# Patient Record
Sex: Female | Born: 1990
Health system: Southern US, Community
[De-identification: ages and names within clinical notes are randomized; demographics above are authoritative.]

## PROBLEM LIST (undated history)

## (undated) DIAGNOSIS — Z789 Other specified health status: Secondary | ICD-10-CM

## (undated) HISTORY — PX: APPENDECTOMY: SHX54

---

## 2018-12-28 NOTE — L&D Delivery Note (Addendum)
Delivery Note  Danielle Smith is a 28 y.o. G2P1001 s/p SVD at [redacted]w[redacted]d. She was admitted for PD IOL  ROM: 2h 5m with clear  fluid GBS Status: negative   Maximum Maternal Temperature: 98.4 F  Labor Progress: . Patient presented to L&D for IOL for post dates SVE was 3 cm and induced with pitocin, she then progressed to complete.   Delivery Date/Time: Delivery: Called to room and patient was complete and pushing. Head delivered LOA. Tight nuchal cord present x 1, unable to reduce and delivered through with somersault. Shoulder and body delivered in usual fashion. Infant with spontaneous cry, placed on mother's abdomen, dried and stimulated. Cord clamped x 2 after 1-minute delay, and cut by father. Cord blood drawn. Placenta delivered spontaneously with gentle cord traction. Fundus firm with massage and Pitocin. Labia, perineum, vagina, and cervix inspected inspected with 2nd degree perineal laceration which was repaired in usual fashion with 3-0 vicryl.  Baby Weight:  4465 grams  Placenta: Sent to L&D Complications: Nuchal x 1 Lacerations: 2nd degree perineal laceration  EBL: 350 ml  Analgesia: Epidural  Infant: APGAR (1 MIN): 8   APGAR (5 MINS): 9    Maxie Better, MD, PGY1  [facility], [place] 10/25/2019, 8:11 PM   OB FELLOW ATTESTATION  I was present, gloved, and supervising throughout the delivery and repair.   Augustin Coupe, MD/[facility] OB Fellow  10/25/2019, 8:23 PM

## 2019-05-10 ENCOUNTER — Other Ambulatory Visit: Payer: Self-pay

## 2019-05-10 ENCOUNTER — Ambulatory Visit (INDEPENDENT_AMBULATORY_CARE_PROVIDER_SITE_OTHER): Payer: BLUE CROSS/BLUE SHIELD | Admitting: Obstetrics and Gynecology

## 2019-05-10 VITALS — BP 115/71 | HR 81 | Ht 65.0 in | Wt 192.0 lb

## 2019-05-10 DIAGNOSIS — O0932 Supervision of pregnancy with insufficient antenatal care, second trimester: Secondary | ICD-10-CM

## 2019-05-10 DIAGNOSIS — Z113 Encounter for screening for infections with a predominantly sexual mode of transmission: Secondary | ICD-10-CM

## 2019-05-10 DIAGNOSIS — Z3A17 17 weeks gestation of pregnancy: Secondary | ICD-10-CM

## 2019-05-10 DIAGNOSIS — Z3480 Encounter for supervision of other normal pregnancy, unspecified trimester: Secondary | ICD-10-CM

## 2019-05-10 NOTE — Progress Notes (Signed)
History:   Danielle Smith is a 28 y.o. G2P1001 at 6372w0d by LMP being seen today for her first obstetrical visit.  Her obstetrical history is significant for Late to care. Patient does intend to breast feed. Pregnancy history fully reviewed. Desired pregnancy. Patient recently moved from [place]. She is new to the area.   Patient reports no complaints.  HISTORY: OB History  Gravida Para Term Preterm AB Living  2 1 1  0 0 1  SAB TAB Ectopic Multiple Live Births  0 0 0 0 0    # Outcome Date GA Lbr Len/2nd Weight Sex Delivery Anes PTL Lv  2 Current           1 Term             Last pap smear was done 2020 and was normal Patient is fully dressed today at her visit and is ok with having pap done PP.   History reviewed. No pertinent past medical history. Past Surgical History:  Procedure Laterality Date  . APPENDECTOMY     Family History  Problem Relation Age of Onset  . Breast cancer Paternal Grandmother    Social History   Tobacco Use  . Smoking status: Never Smoker  . Smokeless tobacco: Never Used  Substance Use Topics  . Alcohol use: Never    Frequency: Never  . Drug use: Never   Not on File No current outpatient medications on file prior to visit.   No current facility-administered medications on file prior to visit.     Review of Systems Pertinent items noted in HPI and remainder of comprehensive ROS otherwise negative. Physical Exam:   Vitals:   05/10/19 1024 05/10/19 1037  BP: 115/71   Pulse: 81   Weight: 192 lb (87.1 kg)   Height:  5\' 5"  (1.651 m)   Fetal Heart Rate (bpm): 153 Uterus: Size equal to dates  System: General: well-developed, well-nourished female in no acute distress   Breasts:  normal appearance, no masses or tenderness bilaterally   Skin: normal coloration and turgor, no rashes   Neurologic: oriented, normal, negative, normal mood   Extremities: normal strength, tone, and muscle mass, ROM of all joints is normal   HEENT PERRLA, extraocular  movement intact and sclera clear, anicteric   Mouth/Teeth mucous membranes moist, pharynx normal without lesions and dental hygiene good   Neck supple and no masses   Cardiovascular: regular rate and rhythm   Respiratory:  no respiratory distress, normal breath sounds   Abdomen: soft, non-tender; bowel sounds normal; no masses,  no organomegaly     Assessment:    Pregnancy: G2P1001 Patient Active Problem List   Diagnosis Date Noted  . Late prenatal care affecting pregnancy in second trimester 05/11/2019  . Supervision of other normal pregnancy, antepartum 05/10/2019    Plan:    1. Supervision of other normal pregnancy, antepartum  - Obstetric panel - HIV antibody (with reflex) - GC/Chlamydia probe amp ([place]not at [facility] - Culture, OB Urine - Babyscripts Schedule Optimization   Initial labs drawn. Continue prenatal vitamins. Genetic Screening discussed, Declined all genetic screening.: declined. Ultrasound discussed; fetal anatomic survey: requested. Problem list reviewed and updated. The nature [facility] with multiple MDs and other Advanced Practice Providers was explained to patient; also emphasized that residents, students are part of our team. Routine obstetric precautions reviewed. Return in about 4 weeks (around 06/07/2019) for virtual visit. Marland Kitchen.     Rasch, Victorino DikeJennifer  I, NP [facility] for [employer], [facility]

## 2019-05-10 NOTE — Progress Notes (Signed)
Pt thinks last pap was 3 years ago- normal

## 2019-05-10 NOTE — Patient Instructions (Addendum)
AREA PEDIATRIC/FAMILY PRACTICE PHYSICIANS  [place] ([address]) . [facility] o Chambliss, MD; Eniola, MD; Hale, MD; Hensel, MD; McDiarmid, MD; McIntyer, MD; Neal, MD; Walden, MD o [address] [place], [place] [address] o [phone] o Mon-Fri 8:30-12:30, 1:30-5:00 o Providers come to see babies at [facility] o Accepting Medicaid . [facility] o Limited providers who accept newborns: Koirala, MD; Morrow, MD; Wolters, MD o [address], [place], [place] [address] o [phone] o Mon-Fri 8:00-5:30 o Babies seen by providers at [facility] o Does NOT accept Medicaid o Please call early in hospitalization for appointment (limited availability)  . [facility] o Mulberry, MD o [address] [place], [place] [address] o [phone] o Mon, Tue, Thur, Fri 8:30-5:00, Wed 10:00-7:00 (closed 1-2pm) o Babies seen by [facility] providers o Accepting Medicaid . [facility] - Pediatrician o Rubin, MD o [address], [place], [place] [address] o [phone] o Mon-Fri 8:30-5:00, Sat 8:30-12:00 o Provider comes to see babies at [facility] o Accepting Medicaid o Must have been referred from current patients or contacted office prior to delivery . [facility] ([facility]) o Brown, MD; Chandler, MD; Ettefagh, MD; Grant, MD; Lester, MD; McCormick, MD; McQueen, MD; Prose, MD; Simha, MD; Stanley, MD; Stryffeler, NP; Tebben, NP o [address], [place], [place] [address] o [phone] o Mon, Tue, Thur, Fri 8:30-5:30, Wed 9:30-5:30, Sat 8:30-12:30 o Babies seen by [facility] providers o Accepting Medicaid o Only accepting infants of first-time parents or siblings of current patients o Hospital discharge coordinator will make follow-up appointment . Jack Amos o [address],  [place], [place]  [address] o [phone]   Fax - [phone] . [facility] o [address], [place], [place]  [address] o Phone - [phone]   Fax - [phone] . Shilpa Gosrani o [address], [place], [place]  [address] o [phone]  [place] ([address]) . [facility] o Bates, MD; [place], MD; Cooper, Cox, MD; MD; Davis, MD; Dovico, MD; Ettefaugh, MD; Little, MD; Lowe, MD; Keiffer, MD; Melvin, MD; Sumner, MD; Williams, MD o [address], [place], [place] [address] o [phone] o Mon-Fri 8:30-5:00 (extended evenings Mon-Thur as needed), Sat-Sun 10:00-1:00 o Providers come to see babies at [facility] o Accepting Medicaid for families of first-time babies and families with all children in the household age 3 and under. Must register with office prior to making appointment (M-F only). . [facility] o Henson, NP; Knapp, MD; Lalonde, MD; Tysinger, PA o [address] [place], [place] [address] o [phone] o Mon-Fri 8:00-5:00 o Babies seen by providers at [facility] o Does NOT accept Medicaid/Commercial Insurance Only . [facility] [place] ([facility])  o Artis, MD; Barnes, MD; Bratton, MD; Coccaro, MD; Lockett Gardner, MD; Kramer, MD; Marshall, MD; Netherton, MD; Poleto, MD; Skinner, MD o [address] [place], [place] [address] o [phone] o Mon-Fri 8:30-5:30, Sat (Oct.-Mar.) 9:00-1:00 o Babies seen by providers at [facility] o Accepting Medicaid  [place] ([address]) . [facility] o Reid, MD; Warner, MD o [address], [place],  [address] o [phone] o Mon-Fri 8:30-5:00, Sat 8:30-12:00 o Providers come to see babies at [facility] o Does NOT accept Medicaid . [facility] o Becker, PA; Hagler, MD; Scifres, PA; Sun, MD; Swayne, MD o [address],  [place], [place] [address] o [phone] o Mon-Fri 8:00-5:00 o Babies seen by providers at [facility] o Does NOT accept Medicaid o Only accepting babies of parents who are patients o Please call early in hospitalization for appointment (limited availability) . [facility] o Clark, MD; Frye, MD; Kelleher, MD; Mack, NP; Miller, MD; O'Keller, MD; Patterson, NP; Pudlo, MD; Puzio, MD; Thomas, MD; Tucker, MD; Twiselton, MD o [address], [place], [place] [address] o [phone] o Mon-Fri 8:00-5:00, Sat 9:00-12:00 o Providers come to see babies at [facility] o Does NOT accept Medicaid  [place] ([address]) . [facility] o Limited providers accepting new patients: Brake, NP; Wharton, PA o [address], [place], [place] [address] o [phone] o Mon-Fri 8:00-5:00 o Babies seen by providers at [facility] o Does NOT accept Medicaid o Only accepting babies of parents who are patients o Please call early in hospitalization for appointment (limited availability) . [facility] o Gay, MD; Quinlan, MD o [address] [place], [place] [address] o [phone] (press 1 to schedule appointment) o Mon-Fri 8:00-5:00 o Providers come to see babies at [facility] o Does NOT accept Medicaid . [facility] o Mazer, MD o [address] [place], [place] [address] o [phone] o Mon-Fri 8:30-5:00 (lunch 12:30-1:00), extended hours by appointment only Wed 5:00-6:30 o Babies seen by [facility] providers o Accepting Medicaid . [facility] o Banks, MD; Jordan, MD; Koberlein, MD o [address], [place], [place] [address] o [phone] o Mon-Fri 8:00-5:00 o Babies seen by [facility] providers o Does NOT accept Medicaid . [facility] o Parker, MD; Hunter, MD; Wallace, DO o [address] [place], [place]  [address] o [phone] o Mon-Fri 8:00-5:00 o Babies seen by [facility] providers o Does NOT accept Medicaid . [facility] o Brandon, PA; Brecken, PA; Christy, NP; Dees, MD; DeClaire, MD; DeWeese, MD; Hansen, NP; Mills, NP; Parrish, NP; Smoot, NP; Summer, MD; Vapne, MD o [address] [place], [place] [address] o [phone] o Mon-Fri 8:30-5:00, Sat 10:00-1:00 o Providers come to see babies at [facility] o Does NOT accept Medicaid o Free prenatal information session Tuesdays at 4:45pm . [facility] o Bouska, MD; Gordon, PA; Jeffery, PA; Weber, PA o [address] [place] [address] o [phone] o Mon-Fri 7:30-5:30 o Babies seen by [facility] providers . [facility] o [address], [place], [place], [place]  [address] o [phone]   Fax - [phone]  [place] ([phone]) . [facility] o Reese, MD o [address] [place], [place] [address] o [phone] o Mon-Thur 8:00-6:00 o Providers come to see babies at [facility] o Accepting Medicaid . [facility] o Anderson, NP; Badger, MD; Beal, PA; Spencer, PA o [address] [place],  [address] o [phone] o Mon-Thur 7:30-7:30, Fri 7:30-4:30 o Babies seen by [facility] providers o Accepting Medicaid . [facility] o Agbuya, MD; Klett, NP; Romgoolam, MD o [address], ,  [address] o [phone] o Mon-Fri 8:30-5:00, Sat 8:30-12:00 o Providers come to see babies at [facility] o Accepting Medicaid o Must have "Meet & Greet" appointment at office prior to delivery . [facility]  ([facility] ) o McCord,   MD; Juleen China, MD; Clydene Laming, [address], [place], [place] [address] o [phone] o Mon-Wed 8:00-6:00, Thur-Fri 8:00-5:00, Sat 9:00-12:00 o Providers come to  see babies at [facility] o Does NOT accept Medicaid o Only accepting siblings of current patients . [facility] [place], [place], [place], [place]  [address] o [phone]   Fax [phone] . [facility] [place] [address], [place], [place]  [address] o [phone]   Fax - Morton Gorman [phone] & [phone]) . Therapist, music at [place], DO; [place], [address] [place], [place] [address] o [phone] o Mon-Fri 7:00-5:00 o Babies seen by [facility] providers o Does NOT accept Medicaid . [place], MD; [place], [place]; [place], [place], [place], [place] [address] o [phone] o Mon-Fri 8:00-5:00 o Babies seen by [facility] providers o Accepting Medicaid . Lamont, MD; [place], [place]; [place], NP; [place], [place], [place], [place] [address] o [phone] o Mon-Fri 8:00-5:00 o Babies seen by providers at [facility] [place] [phone]) . [facility] [place], [place] o [address] [place], [place] [address] o [phone] o Mon-Fri 8:00-5:00 o Babies seen by [facility] providers o Does NOT accept Medicaid o Limited availability, please call early in hospitalization to schedule follow-up . [facility] Leilani Merl, PA; Maisie Fus, MD; [place], MD; [place], [place]; Jeannine Kitten, MD; [place], [place] [facility] [address], [place], [place] [address] o [phone] o Mon-Fri 8:30-5:00, Sat 9:00-12:00 o Babies seen by providers at [facility] o Accepting Medicaid o Please register online then schedule online or call office o [email or web address] . [place] [facility]  [place]) Kristian Covey, NP; Dwyane Dee, MD; Leonidas Romberg, PA o [address] [place], [place], [place] [address] o [phone] o Mon-Fri 8:00-5:00 o Babies seen by providers at [facility] o Accepting Medicaid . [place] ([facility]) Dairl Ponder, MD; Rayvon Char, NP; Melina Modena, MD o [address] [place], [place], [place] [address] o [phone] o Mon-Fri 8:00-5:30, Sat&Sun by appointment (phones open at 8:30) o Babies seen by [facility] providers o Accepting Medicaid o Must be a first-time baby or sibling of current patient . [place], [address], [place], [place]  [address] o [phone]   Fax - [phone]  [place] [phone] & [phone]) . [place], [place]; [place], [place]; Benjamine Mola, MD; [place], [place]; Harrell Lark, MD o [address] [place], [place] [address] o [phone] o Mon-Thur 8:00-7:00, Fri 8:00-5:00, Sat 8:00-12:00, Sun 9:00-12:00 o Babies seen by [facility] providers o Accepting Medicaid . [facility], MD; Ruthann Cancer, MD; [facility], MD o [address], [place], [place] [address] o [phone] o Mon-Thur 8:00-5:00 o Babies seen by providers at [facility] o Accepting Medicaid . [facility] [place], MD; Coe[place]Goins, MD; Amedeo Plenty, MD; Bobby Rumpf, MD; List, MD; Lavonia Drafts, MD; Ruthann Cancer, MD; Selinda Eon, MD; Audie Box, MD; Jim Like, MD; Christie Nottingham, MD; Hubbard Hartshorn, MD; Modena Nunnery, MD o [address] [place], [place]  [address] o [phone] o Mon-Fri 8:00-5:30, Sat (Oct.-Mar.) 9:00-1:00 o Babies seen by providers at [facility] o Accepting Medicaid o Must fill out new patient packet, available online at [email or web address] . [facility] [place] [facility]) Simone Curia, NP; Tiburcio Pea, NP; Tresa Endo, NP; Whitney Post, MD;  [place], [place]; Hennie Duos, MD; Wynne Dust, MD; Kavin Leech, NP o [address], [place], [place] [address] o [phone] o Mon-Thur 8:00-5:30, Fri 8:00-5:00 o Babies seen by providers at [facility] o Accepting [facility] [phone]) . [facility] o [place], [place]; [place], MD; Tanya Nones, MD; [place], [place] o [address] [place], [place] [address] o [phone] o Mon-Fri 8:00-5:00 o Babies seen by providers at [facility] o Accepting [facility] [phone]) . [facility] o [place], DO; Lenise Arena, MD; [place], [place] o [address], [place], [place] [address] o [phone] o Mon-Fri 8:00-5:00 o Babies seen by providers at [facility] o Does NOT accept Medicaid o Limited appointment availability, please call early in hospitalization  . Nature conservation officer at [facility] o Accident, DO; [place], MD o [address] [place], [place] [address] o [phone] o Mon-Fri 8:00-5:00 o Babies seen by [facility] providers o Does NOT accept Medicaid . [facility] Lorrine Kin, MD; Ninetta Lights, MD; [place], [place]; [place], MD o [address], [place], [place] [address] o [phone] o Mon-Fri 8:00-5:00 o After hours clinic [facility] [place], [place] [address]) [phone] Mon-Fri 5:00-8:00, Sat 12:00-6:00, Sun 10:00-4:00 o Babies seen by [facility] providers o Accepting Medicaid . [facility] o [address], [place], [place]  [address] o [phone]   Fax - [phone]  [place] [phone]) . Nature conservation officer at [facility], MD o [address] [place], [place], [place] [address] o [phone] o Mon-Fri 8:00-5:00 o Babies seen by [facility] providers o Does NOT accept Medicaid . [facility] [place] [facility] [place]) Tomi Likens, MD o [address] [place] [address], [place], [place]  [address] o [phone] o Mon-Thur 8:00-7:00, Fri 8:00-5:00, Sat 8:00-12:00 o Babies seen by providers at [facility] o Accepting Medicaid - but does not have vaccinations in office (must be received elsewhere) o Limited availability, please call early in hospitalization   ([phone]) . [facility]  o Wyvonne Lenz, MD o [address], [place] [address] o [phone]  Fax [phone] Tdap Vaccine (Tetanus, Diphtheria and Pertussis): What You Need to Know 1. Why get vaccinated? Tetanus, diphtheria and pertussis are very serious diseases. Tdap vaccine can protect [place] from these diseases. And, Tdap vaccine given to pregnant women can protect newborn babies against pertussis.[place] TETANUS (Lockjaw) is rare in the [place] today. It causes painful muscle tightening and stiffness, usually all over the body.  It can lead to tightening of muscles in the head and neck so you can't open your mouth, swallow, or sometimes even breathe. Tetanus kills about 1 out of 10 people who are infected even after receiving the best medical care. DIPHTHERIA is also rare in the [place] today. It can cause a thick coating to form in the back of the throat.  It can lead to breathing problems, heart failure, paralysis, and death. PERTUSSIS (Whooping Cough) causes severe coughing spells, which can cause difficulty breathing, vomiting and disturbed sleep.  It can also lead  to weight loss, incontinence, and rib fractures. Up to 2 in 100 adolescents and 5 in 100 adults with pertussis are hospitalized or have complications, which could include pneumonia or death. These diseases are caused by bacteria. Diphtheria and pertussis are spread from person to person through secretions from coughing or sneezing. Tetanus enters the body through cuts, scratches, or wounds. Before vaccines, as many as 200,000 cases of diphtheria, 200,000 cases of pertussis, and hundreds of cases of tetanus, were  reported in the [place] each year. Since vaccination began, reports of cases for tetanus and diphtheria have dropped by about 99% and for pertussis by about 80%. 2. Tdap vaccine Tdap vaccine can protect adolescents and adults from tetanus, diphtheria, and pertussis. One dose of Tdap is routinely given at age 211 or 5912. People who did not get Tdap at that age should get it as soon as possible. Tdap is especially important for healthcare professionals and anyone having close contact with a baby younger than 12 months. Pregnant women should get a dose of Tdap during every pregnancy, to protect the newborn from pertussis. Infants are most at risk for severe, life-threatening complications from pertussis. Another vaccine, called Td, protects against tetanus and diphtheria, but not pertussis. A Td booster should be given every 10 years. Tdap may be given as one of these boosters if you have never gotten Tdap before. Tdap may also be given after a severe cut or burn to prevent tetanus infection. Your doctor or the person giving you the vaccine can give you more information. Tdap may safely be given at the same time as other vaccines. 3. Some people should not get this vaccine  A person who has ever had a life-threatening allergic reaction after a previous dose of any diphtheria, tetanus or pertussis containing vaccine, OR has a severe allergy to any part of this vaccine, should not get Tdap vaccine. Tell the person giving the vaccine about any severe allergies.  Anyone who had coma or long repeated seizures within 7 days after a childhood dose of DTP or DTaP, or a previous dose of Tdap, should not get Tdap, unless a cause other than the vaccine was found. They can still get Td.  Talk to your doctor if you: ? have seizures or another nervous system problem, ? had severe pain or swelling after any vaccine containing diphtheria, tetanus or pertussis, ? ever had a condition called Guillain-Barr Syndrome  (GBS), ? aren't feeling well on the day the shot is scheduled. 4. Risks With any medicine, including vaccines, there is a chance of side effects. These are usually mild and go away on their own. Serious reactions are also possible but are rare. Most people who get Tdap vaccine do not have any problems with it. Mild problems following Tdap (Did not interfere with activities)  Pain where the shot was given (about 3 in 4 adolescents or 2 in 3 adults)  Redness or swelling where the shot was given (about 1 person in 5)  Mild fever of at least 100.64F (up to about 1 in 25 adolescents or 1 in 100 adults)  Headache (about 3 or 4 people in 10)  Tiredness (about 1 person in 3 or 4)  Nausea, vomiting, diarrhea, stomach ache (up to 1 in 4 adolescents or 1 in 10 adults)  Chills, sore joints (about 1 person in 10)  Body aches (about 1 person in 3 or 4)  Rash, swollen glands (uncommon) Moderate problems following Tdap (Interfered with activities, but  did not require medical attention)  Pain where the shot was given (up to 1 in 5 or 6)  Redness or swelling where the shot was given (up to about 1 in 16 adolescents or 1 in 12 adults)  Fever over 102F (about 1 in 100 adolescents or 1 in 250 adults)  Headache (about 1 in 7 adolescents or 1 in 10 adults)  Nausea, vomiting, diarrhea, stomach ache (up to 1 or 3 people in 100)  Swelling of the entire arm where the shot was given (up to about 1 in 500). Severe problems following Tdap (Unable to perform usual activities; required medical attention)  Swelling, severe pain, bleeding and redness in the arm where the shot was given (rare). Problems that could happen after any vaccine:  People sometimes faint after a medical procedure, including vaccination. Sitting or lying down for about 15 minutes can help prevent fainting, and injuries caused by a fall. Tell your doctor if you feel dizzy, or have vision changes or ringing in the ears.  Some  people get severe pain in the shoulder and have difficulty moving the arm where a shot was given. This happens very rarely.  Any medication can cause a severe allergic reaction. Such reactions from a vaccine are very rare, estimated at fewer than 1 in a million doses, and would happen within a few minutes to a few hours after the vaccination. As with any medicine, there is a very remote chance of a vaccine causing a serious injury or death. The safety of vaccines is always being monitored. For more information, visit: http://floyd.org/ 5. What if there is a serious problem? What should I look for?  Look for anything that concerns you, such as signs of a severe allergic reaction, very high fever, or unusual behavior. Signs of a severe allergic reaction can include hives, swelling of the face and throat, difficulty breathing, a fast heartbeat, dizziness, and weakness. These would usually start a few minutes to a few hours after the vaccination. What should I do?  If you think it is a severe allergic reaction or other emergency that can't wait, call 9-1-1 or get the person to the nearest hospital. Otherwise, call your doctor.  Afterward, the reaction should be reported to the Vaccine Adverse Event Reporting System (VAERS). Your doctor might file this report, or you can do it yourself through the VAERS web site at www.vaers.LAgents.no, or by calling 1-(307) 257-6437. VAERS does not give medical advice. 6. The National Vaccine Injury Compensation Program The [employer] Vaccine Injury Compensation Program (VICP) is a federal program that was created to compensate people who may have been injured by certain vaccines. Persons who believe they may have been injured by a vaccine can learn about the program and about filing a claim by calling 1-785-250-9645 or visiting the VICP website at SpiritualWord.at. There is a time limit to file a claim for compensation. 7. How can I learn  more?  Ask your doctor. He or she can give you the vaccine package insert or suggest other sources of information.  Call your local or state health department.  Contact the Centers for Disease Control and Prevention (CDC): ? Call (250)786-2374 (1-800-CDC-INFO) or ? Visit CDC's website at PicCapture.uy Vaccine Information Statement Tdap Vaccine (02/20/2014) This information is not intended to replace advice given to you by your health care provider. Make sure you discuss any questions you have with your health care provider. Document Released: 06/14/2012 Document Revised: 08/01/2018 Document Reviewed: 08/01/2018 Elsevier Interactive Patient Education  2019 [place].

## 2019-05-11 DIAGNOSIS — O0932 Supervision of pregnancy with insufficient antenatal care, second trimester: Secondary | ICD-10-CM | POA: Insufficient documentation

## 2019-05-11 LAB — GC/CHLAMYDIA PROBE AMP (~~LOC~~) NOT AT ARMC
Chlamydia: NEGATIVE
Neisseria Gonorrhea: NEGATIVE

## 2019-05-12 LAB — OBSTETRIC PANEL
Absolute Monocytes: 390 cells/uL (ref 200–950)
Antibody Screen: NOT DETECTED
Basophils Absolute: 30 cells/uL (ref 0–200)
Basophils Relative: 0.4 %
Eosinophils Absolute: 98 cells/uL (ref 15–500)
Eosinophils Relative: 1.3 %
HCT: 36 % (ref 35.0–45.0)
Hemoglobin: 12.6 g/dL (ref 11.7–15.5)
Hepatitis B Surface Ag: NONREACTIVE
Lymphs Abs: 1530 cells/uL (ref 850–3900)
MCH: 30.7 pg (ref 27.0–33.0)
MCHC: 35 g/dL (ref 32.0–36.0)
MCV: 87.6 fL (ref 80.0–100.0)
MPV: 11.8 fL (ref 7.5–12.5)
Monocytes Relative: 5.2 %
Neutro Abs: 5453 cells/uL (ref 1500–7800)
Neutrophils Relative %: 72.7 %
Platelets: 171 10*3/uL (ref 140–400)
RBC: 4.11 10*6/uL (ref 3.80–5.10)
RDW: 13 % (ref 11.0–15.0)
RPR Ser Ql: NONREACTIVE
Rubella: 11.3 index
Total Lymphocyte: 20.4 %
WBC: 7.5 10*3/uL (ref 3.8–10.8)

## 2019-05-12 LAB — CULTURE, OB URINE

## 2019-05-12 LAB — URINE CULTURE, OB REFLEX

## 2019-05-12 LAB — HIV ANTIBODY (ROUTINE TESTING W REFLEX): HIV 1&2 Ab, 4th Generation: NONREACTIVE

## 2019-05-26 ENCOUNTER — Ambulatory Visit (HOSPITAL_COMMUNITY)
Admission: RE | Admit: 2019-05-26 | Discharge: 2019-05-26 | Disposition: A | Discharge status: Home / Self Care | Payer: BLUE CROSS/BLUE SHIELD | Source: Ambulatory Visit | Attending: Obstetrics and Gynecology | Admitting: Obstetrics and Gynecology

## 2019-05-26 ENCOUNTER — Other Ambulatory Visit: Payer: Self-pay

## 2019-05-26 DIAGNOSIS — Z3480 Encounter for supervision of other normal pregnancy, unspecified trimester: Secondary | ICD-10-CM | POA: Diagnosis not present

## 2019-05-26 DIAGNOSIS — Z363 Encounter for antenatal screening for malformations: Secondary | ICD-10-CM

## 2019-05-26 DIAGNOSIS — Z3A19 19 weeks gestation of pregnancy: Secondary | ICD-10-CM | POA: Diagnosis not present

## 2019-05-29 ENCOUNTER — Other Ambulatory Visit (HOSPITAL_COMMUNITY): Payer: Self-pay | Admitting: *Deleted

## 2019-05-29 DIAGNOSIS — Z362 Encounter for other antenatal screening follow-up: Secondary | ICD-10-CM

## 2019-06-07 ENCOUNTER — Ambulatory Visit (INDEPENDENT_AMBULATORY_CARE_PROVIDER_SITE_OTHER): Payer: BC Managed Care – PPO | Admitting: Obstetrics and Gynecology

## 2019-06-07 ENCOUNTER — Other Ambulatory Visit: Payer: Self-pay

## 2019-06-07 VITALS — BP 112/68 | HR 84 | Wt 196.0 lb

## 2019-06-07 DIAGNOSIS — Z3482 Encounter for supervision of other normal pregnancy, second trimester: Secondary | ICD-10-CM

## 2019-06-07 DIAGNOSIS — Z3A21 21 weeks gestation of pregnancy: Secondary | ICD-10-CM

## 2019-06-07 DIAGNOSIS — Z3480 Encounter for supervision of other normal pregnancy, unspecified trimester: Secondary | ICD-10-CM

## 2019-06-07 NOTE — Progress Notes (Signed)
   TELEHEALTH VIRTUAL OBSTETRICS VISIT ENCOUNTER NOTE  I connected with Danielle Smith on 06/07/19 at 10:15 AM EDT by telephone at home and verified that I am speaking with the correct person using two identifiers.   I discussed the limitations, risks, security and privacy concerns of performing an evaluation and management service by telephone and the availability of in person appointments. I also discussed with the patient that there may be a patient responsible charge related to this service. The patient expressed understanding and agreed to proceed.  Subjective:  Danielle Smith is a 28 y.o. G2P1001 at [redacted]w[redacted]d being followed for ongoing prenatal care.  She is currently monitored for the following issues for this low-risk pregnancy and has Supervision of other normal pregnancy, antepartum and Late prenatal care affecting pregnancy in second trimester on their problem list.  Patient reports no complaints. Reports fetal movement. Denies any contractions, bleeding or leaking of fluid.   The following portions of the patient's history were reviewed and updated as appropriate: allergies, current medications, past family history, past medical history, past social history, past surgical history and problem list.   Objective:   General:  Alert, oriented and cooperative.   Mental Status: Normal mood and affect perceived. Normal judgment and thought content.  Rest of physical exam deferred due to type of encounter  Assessment and Plan:  Pregnancy: G2P1001 at [redacted]w[redacted]d  1. Supervision of other normal pregnancy, antepartum  BP 112/68 Doing well Virtual visit in 4 weeks, in office visit in 8 weeks for 2 hour GTT    There are no diagnoses linked to this encounter. Preterm labor symptoms and general obstetric precautions including but not limited to vaginal bleeding, contractions, leaking of fluid and fetal movement were reviewed in detail with the patient.  I discussed the assessment and treatment plan  with the patient. The patient was provided an opportunity to ask questions and all were answered. The patient agreed with the plan and demonstrated an understanding of the instructions. The patient was advised to call back or seek an in-person office evaluation/go to MAU at [facility] for any urgent or concerning symptoms. Please refer to After Visit Summary for other counseling recommendations.   I provided 10 minutes of non-face-to-face time during this encounter.  Return in about 4 weeks (around 07/05/2019) for Virtual visit ok .  Future Appointments  Date Time Provider [place]  06/23/2019  2:00 PM [facility]  06/23/2019  2:00 PM [facility]-MFC [place] 3 [facility]-MFCUS MFC-US    Jennifer Rasch, NP Center for [facility], [place]

## 2019-06-23 ENCOUNTER — Ambulatory Visit (HOSPITAL_COMMUNITY): Payer: BC Managed Care – PPO | Admitting: *Deleted

## 2019-06-23 ENCOUNTER — Ambulatory Visit (HOSPITAL_COMMUNITY)
Admission: RE | Admit: 2019-06-23 | Discharge: 2019-06-23 | Disposition: A | Discharge status: Home / Self Care | Payer: BC Managed Care – PPO | Source: Ambulatory Visit | Attending: Obstetrics and Gynecology | Admitting: Obstetrics and Gynecology

## 2019-06-23 ENCOUNTER — Encounter (HOSPITAL_COMMUNITY): Payer: Self-pay | Admitting: *Deleted

## 2019-06-23 ENCOUNTER — Other Ambulatory Visit: Payer: Self-pay

## 2019-06-23 DIAGNOSIS — Z3A23 23 weeks gestation of pregnancy: Secondary | ICD-10-CM | POA: Diagnosis not present

## 2019-06-23 DIAGNOSIS — O0932 Supervision of pregnancy with insufficient antenatal care, second trimester: Secondary | ICD-10-CM | POA: Diagnosis not present

## 2019-06-23 DIAGNOSIS — Z3480 Encounter for supervision of other normal pregnancy, unspecified trimester: Secondary | ICD-10-CM

## 2019-06-23 DIAGNOSIS — Z362 Encounter for other antenatal screening follow-up: Secondary | ICD-10-CM

## 2019-06-23 IMAGING — US US MFM OB FOLLOW UP
1 series · 13 of 28 positions shown · non-contrast
Comparison: none

[Series 1: us mfm ob follow up · 13 of 53 slices shown]
[im 2/53]
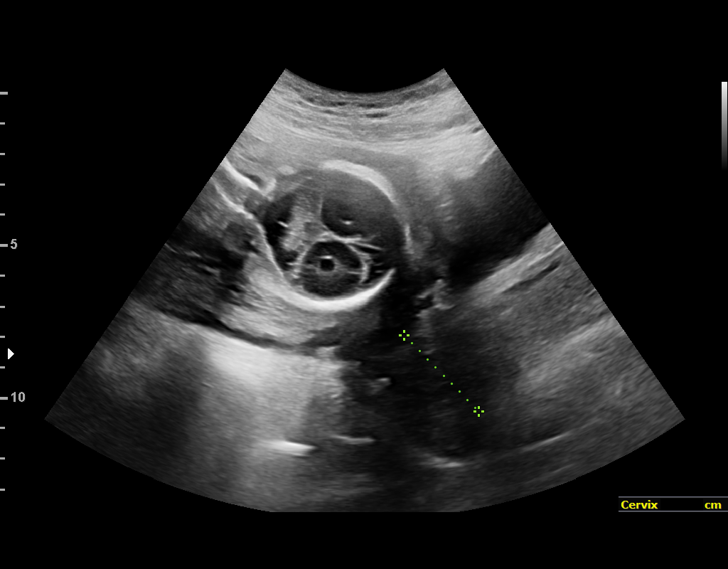
[im 6/53]
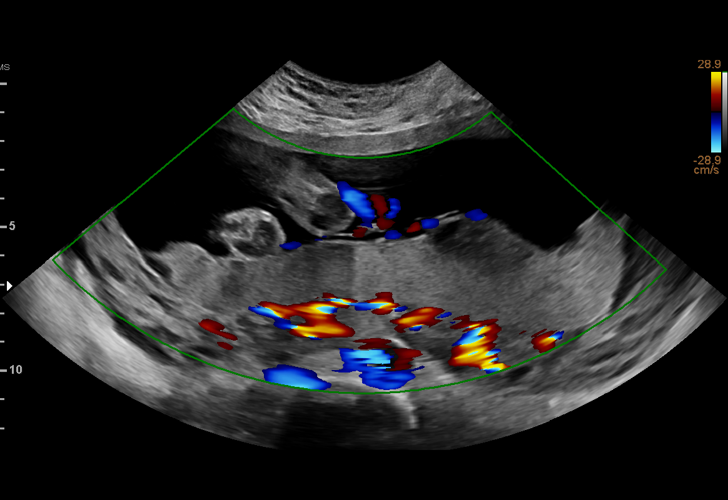
[im 10/53]
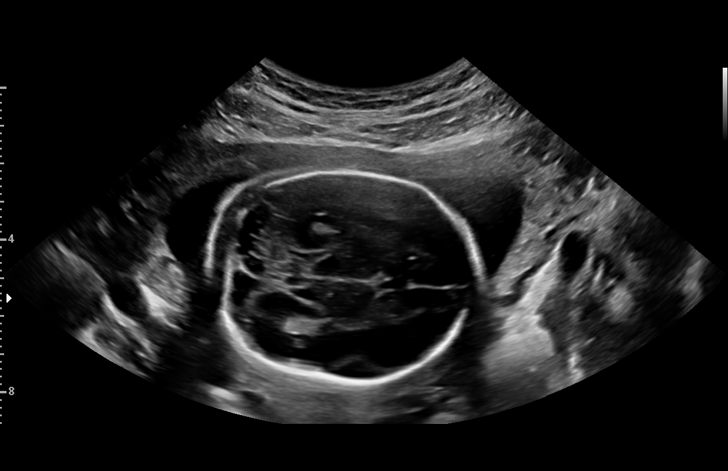
[im 14/53]
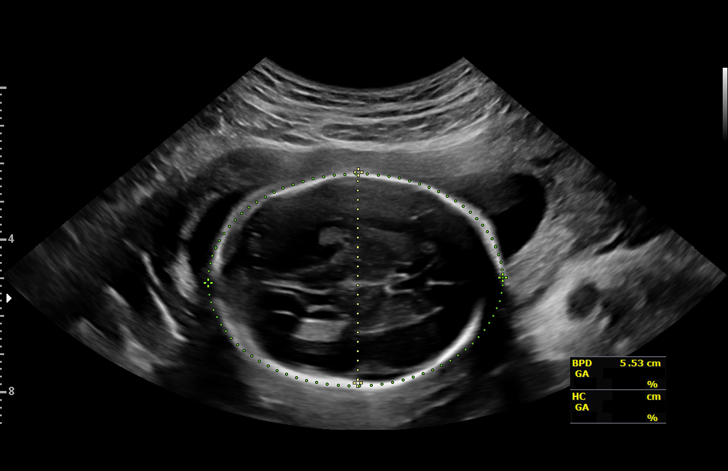
[im 18/53]
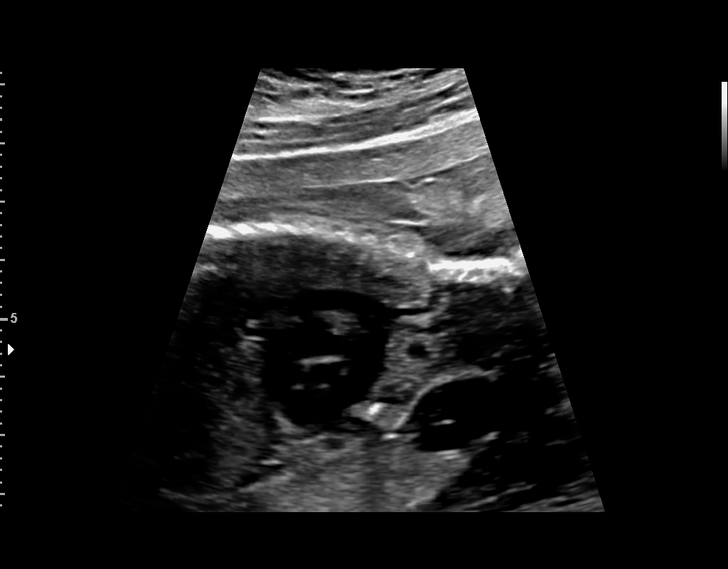
[im 22/53]
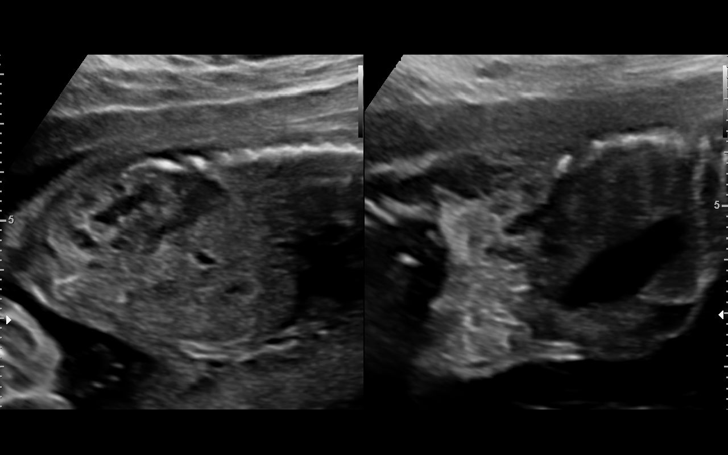
[im 27/53]
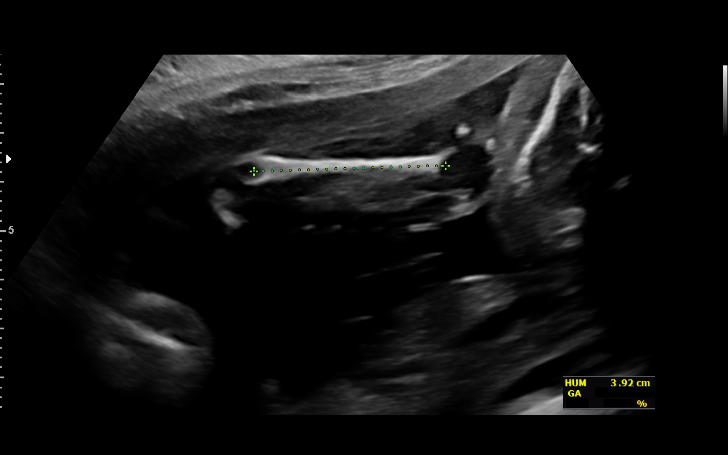
[im 31/53]
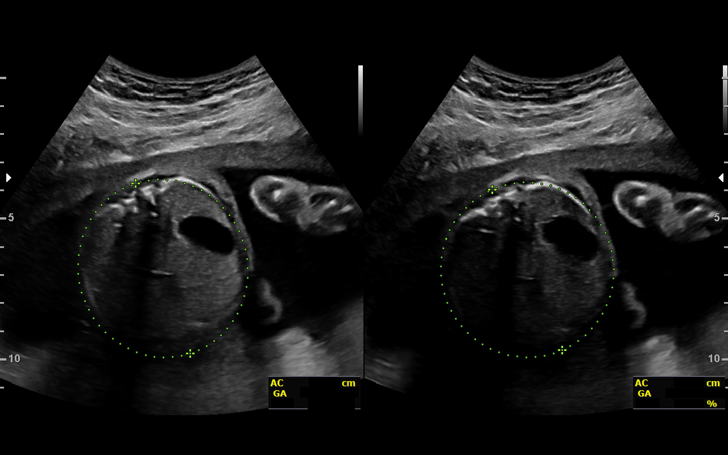
[im 35/53]
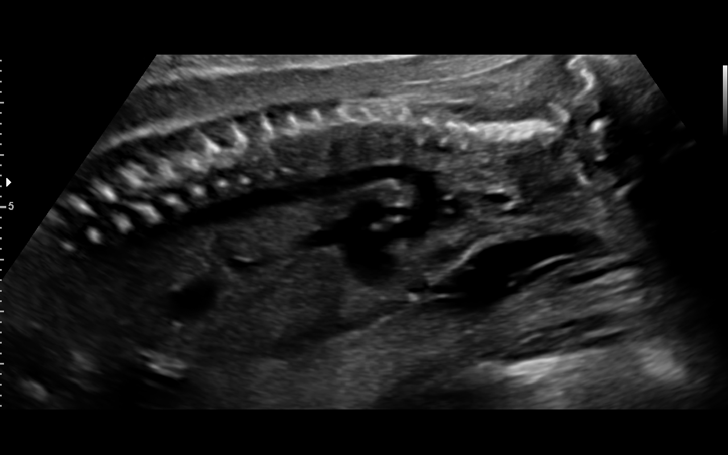
[im 39/53]
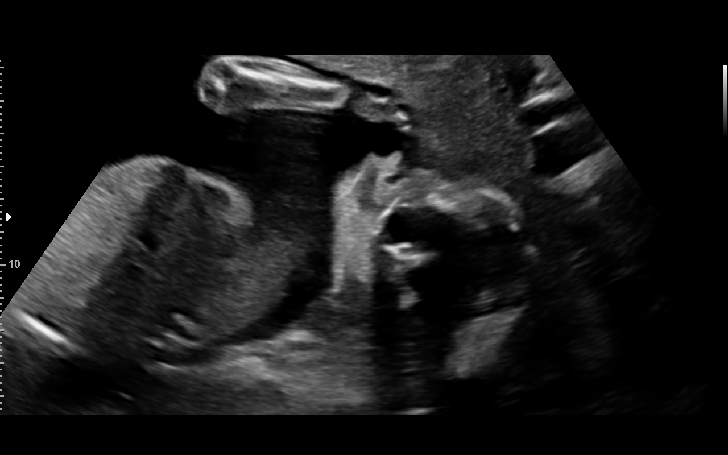
[im 43/53]
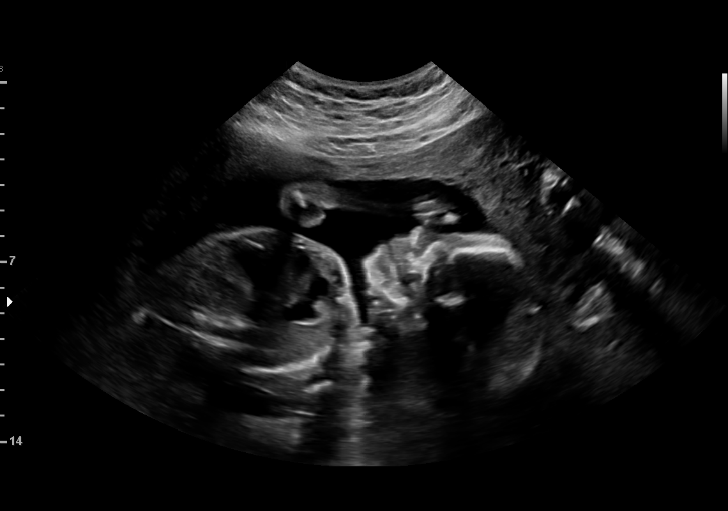
[im 47/53]
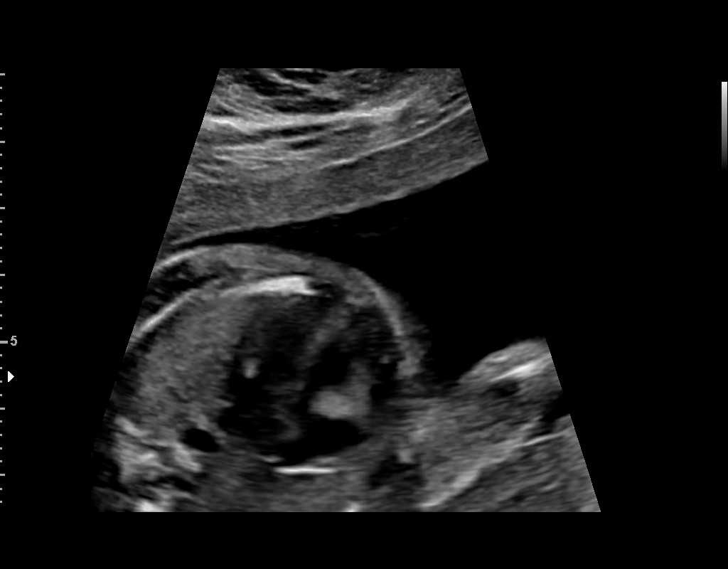
[im 51/53]
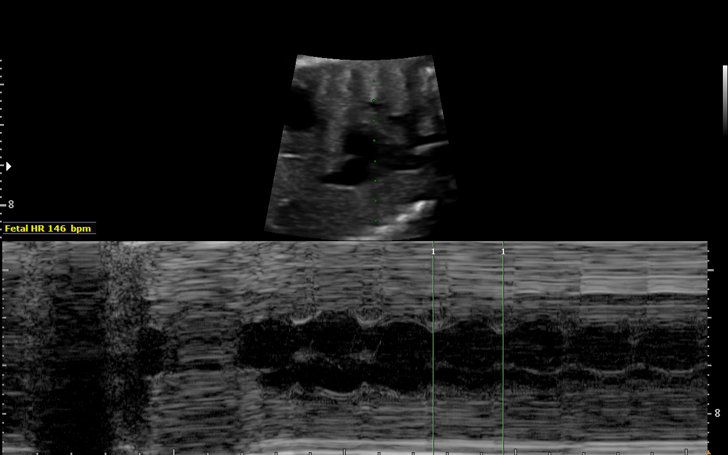

[13 of 28 positions shown; findings below may reference images not displayed]

LAUFER NP

 ----------------------------------------------------------------------

 ----------------------------------------------------------------------
Indications

  Encounter for antenatal screening for          [FV]
  malformations
  Late prenatal care, second trimester           [FV]
  23 weeks gestation of pregnancy
 ----------------------------------------------------------------------
Vital Signs

 BMI:
Fetal Evaluation

 Num Of Fetuses:         1
 Fetal Heart Rate(bpm):  146
 Cardiac Activity:       Observed

 Amniotic Fluid
 AFI FV:      Within normal limits

                             Largest Pocket(cm)

Biometry

 BPD:      55.3  mm     G. Age:  22w 6d         29  %    CI:        69.58   %    70 - 86
                                                         FL/HC:      19.8   %    19.2 -
 HC:      211.6  mm     G. Age:  23w 2d         32  %    HC/AC:      1.09        1.05 -
 AC:      193.3  mm     G. Age:  24w 0d         66  %    FL/BPD:     75.9   %    71 - 87
 FL:         42  mm     G. Age:  23w 5d         51  %    FL/AC:      21.7   %    20 - 24
 HUM:      39.3  mm     G. Age:  24w 0d         58  %

 LV:        4.2  mm

 Est. FW:     627  gm      1 lb 6 oz     66  %
OB History

 Gravidity:    2         Term:   1
 Living:       1
Gestational Age

 LMP:           23w 2d        Date:  [DATE]                 EDD:   [DATE]
 U/S Today:     23w 3d                                        EDD:   [DATE]
 Best:          23w 2d     Det. By:  LMP  ([DATE])          EDD:   [DATE]
Anatomy

 Cranium:               Appears normal         Aortic Arch:            Appears normal
 Cavum:                 Appears normal         Ductal Arch:            Appears normal
 Ventricles:            Appears normal         Diaphragm:              Appears normal
 Choroid Plexus:        Previously seen        Stomach:                Appears normal, left
                                                                       sided
 Cerebellum:            Previously seen        Abdomen:                Appears normal
 Posterior Fossa:       Previously seen        Abdominal Wall:         Appears nml (cord
                                                                       insert, abd wall)
 Nuchal Fold:           Not applicable (>20    Cord Vessels:           Previously seen
                        wks GA)
 Face:                  Appears normal         Kidneys:                Appear normal
                        (orbits and profile)
 Lips:                  Previously seen        Bladder:                Appears normal
 Thoracic:              Appears normal         Spine:                  Limited views
                        Appears normal                                 appear normal
 Heart:                 Not well visualized    Upper Extremities:      Previously seen
 RVOT:                  Appears normal         Lower Extremities:      Previously seen
 LVOT:                  Not well visualized

 Other:  Lt Heels visualized. Female gender Technically difficult due to fetal
         position.
Cervix Uterus Adnexa

 Cervix
 Length:           3.51  cm.
Impression

 Normal interval growth.  No ultrasonic evidence of structural
 fetal anomalies.
 Suboptimal views of the fetal anatomy were obtatined
 secondary to fetal position.
 Late to care-
Recommendations

 Recommend repeat exam in 4 weeks- however, Ms. LAUFER
 declined further exams at this time.

## 2019-07-07 ENCOUNTER — Telehealth (INDEPENDENT_AMBULATORY_CARE_PROVIDER_SITE_OTHER): Payer: BC Managed Care – PPO

## 2019-07-07 VITALS — BP 114/76

## 2019-07-07 DIAGNOSIS — Z3A25 25 weeks gestation of pregnancy: Secondary | ICD-10-CM

## 2019-07-07 DIAGNOSIS — Z3482 Encounter for supervision of other normal pregnancy, second trimester: Secondary | ICD-10-CM

## 2019-07-07 DIAGNOSIS — Z3480 Encounter for supervision of other normal pregnancy, unspecified trimester: Secondary | ICD-10-CM

## 2019-07-07 NOTE — Progress Notes (Signed)
   TELEHEALTH VIRTUAL OBSTETRICS VISIT ENCOUNTER NOTE  I connected with Danielle Smith on 07/07/19 at  9:15 AM EDT by MyChart Virtual Visit at home and verified that I am speaking with the correct person using two identifiers.   I discussed the limitations, risks, security and privacy concerns of performing an evaluation and management service by telephone and the availability of in person appointments. I also discussed with the patient that there may be a patient responsible charge related to this service. The patient expressed understanding and agreed to proceed.  Subjective:  Danielle Smith is a 28 y.o. G2P1001 at [redacted]w[redacted]d being followed for ongoing prenatal care.  She is currently monitored for the following issues for this low-risk pregnancy and has Supervision of other normal pregnancy, antepartum and Late prenatal care affecting pregnancy in second trimester on their problem list.  Patient reports no complaints. Reports fetal movement. Denies any contractions, bleeding or leaking of fluid.   The following portions of the patient's history were reviewed and updated as appropriate: allergies, current medications, past family history, past medical history, past social history, past surgical history and problem list.   Objective:   General:  Alert, oriented and cooperative.   Mental Status: Normal mood and affect perceived. Normal judgment and thought content.  Rest of physical exam deferred due to type of encounter  Assessment and Plan:  Pregnancy: G2P1001 at [redacted]w[redacted]d 1. Supervision of other normal pregnancy, antepartum -BP 110/70. All BPs in babyRx reviewed and normal -U/S results from 6/26 reviewed. Normal growth but suboptimal views of anatomy. Recommended f/u and patient declined -Anticipatory guidance of next visit in person with GTT/labs reviewed with patient  Preterm labor symptoms and general obstetric precautions including but not limited to vaginal bleeding, contractions, leaking  of fluid and fetal movement were reviewed in detail with the patient.  I discussed the assessment and treatment plan with the patient. The patient was provided an opportunity to ask questions and all were answered. The patient agreed with the plan and demonstrated an understanding of the instructions. The patient was advised to call back or seek an in-person office evaluation/go to MAU at [facility] for any urgent or concerning symptoms. Please refer to After Visit Summary for other counseling recommendations.   I provided 15 minutes of non-face-to-face time during this encounter.  Return in about 3 weeks (around 07/28/2019) for Return OB visit, 2hr GTT and labs.  No future appointments.  Wende Mott, [place] for [employer], [place]

## 2019-07-28 ENCOUNTER — Other Ambulatory Visit: Payer: Self-pay

## 2019-07-28 ENCOUNTER — Ambulatory Visit (INDEPENDENT_AMBULATORY_CARE_PROVIDER_SITE_OTHER): Payer: BC Managed Care – PPO | Admitting: Certified Nurse Midwife

## 2019-07-28 VITALS — BP 125/76 | HR 93 | Wt 208.0 lb

## 2019-07-28 DIAGNOSIS — Z23 Encounter for immunization: Secondary | ICD-10-CM

## 2019-07-28 DIAGNOSIS — Z3483 Encounter for supervision of other normal pregnancy, third trimester: Secondary | ICD-10-CM

## 2019-07-28 DIAGNOSIS — Z3480 Encounter for supervision of other normal pregnancy, unspecified trimester: Secondary | ICD-10-CM

## 2019-07-28 DIAGNOSIS — Z3A28 28 weeks gestation of pregnancy: Secondary | ICD-10-CM

## 2019-07-28 NOTE — Progress Notes (Signed)
Subjective:  Danielle Smith is a 28 y.o. G2P1001 at [redacted]w[redacted]d being seen today for ongoing prenatal care.  She is currently monitored for the following issues for this low-risk pregnancy and has Supervision of other normal pregnancy, antepartum and Late prenatal care affecting pregnancy in second trimester on their problem list.  Patient reports no complaints.  Contractions: Not present. Vag. Bleeding: None.  Movement: Present. Denies leaking of fluid.   The following portions of the patient's history were reviewed and updated as appropriate: allergies, current medications, past family history, past medical history, past social history, past surgical history and problem list. Problem list updated.  Objective:   Vitals:   07/28/19 0934  BP: 125/76  Pulse: 93  Weight: 208 lb (94.3 kg)    Fetal Status: Fetal Heart Rate (bpm): 148 Fundal Height: 28 cm Movement: Present  Presentation: Vertex  General:  Alert, oriented and cooperative. Patient is in no acute distress.  Skin: Skin is warm and dry. No rash noted.   Cardiovascular: Normal heart rate noted  Respiratory: Normal respiratory effort, no problems with respiration noted  Abdomen: Soft, gravid, appropriate for gestational age. Pain/Pressure: Present     Pelvic: Vag. Bleeding: None Vag D/C Character: Thin   Cervical exam deferred        Extremities: Normal range of motion.  Edema: None  Mental Status: Normal mood and affect. Normal behavior. Normal judgment and thought content.   Urinalysis:      Assessment and Plan:  Pregnancy: G2P1001 at [redacted]w[redacted]d  1. Supervision of other normal pregnancy, antepartum - 2Hr GTT w/ 1 Hr Carpenter 75 g - CBC - HIV antibody (with reflex) - RPR - Tdap vaccine greater than or equal to 7yo IM  Preterm labor symptoms and general obstetric precautions including but not limited to vaginal bleeding, contractions, leaking of fluid and fetal movement were reviewed in detail with the patient. Please refer to  After Visit Summary for other counseling recommendations.  Return in about 4 weeks (around 08/25/2019).- virtual   Julianne Handler, CNM

## 2019-07-31 LAB — 2HR GTT W 1 HR, CARPENTER, 75 G
Glucose, 1 Hr, Gest: 115 mg/dL (ref 65–179)
Glucose, 2 Hr, Gest: 103 mg/dL (ref 65–152)
Glucose, Fasting, Gest: 78 mg/dL (ref 65–91)

## 2019-07-31 LAB — CBC
HCT: 34.9 % — ABNORMAL LOW (ref 35.0–45.0)
Hemoglobin: 11.7 g/dL (ref 11.7–15.5)
MCH: 29.5 pg (ref 27.0–33.0)
MCHC: 33.5 g/dL (ref 32.0–36.0)
MCV: 88.1 fL (ref 80.0–100.0)
MPV: 11.9 fL (ref 7.5–12.5)
Platelets: 173 10*3/uL (ref 140–400)
RBC: 3.96 10*6/uL (ref 3.80–5.10)
RDW: 12 % (ref 11.0–15.0)
WBC: 9.3 10*3/uL (ref 3.8–10.8)

## 2019-07-31 LAB — HIV ANTIBODY (ROUTINE TESTING W REFLEX): HIV 1&2 Ab, 4th Generation: NONREACTIVE

## 2019-07-31 LAB — RPR: RPR Ser Ql: NONREACTIVE

## 2019-08-25 ENCOUNTER — Telehealth (INDEPENDENT_AMBULATORY_CARE_PROVIDER_SITE_OTHER): Payer: BC Managed Care – PPO | Admitting: Certified Nurse Midwife

## 2019-08-25 ENCOUNTER — Other Ambulatory Visit: Payer: Self-pay

## 2019-08-25 ENCOUNTER — Encounter: Payer: Self-pay | Admitting: Certified Nurse Midwife

## 2019-08-25 DIAGNOSIS — Z3483 Encounter for supervision of other normal pregnancy, third trimester: Secondary | ICD-10-CM

## 2019-08-25 DIAGNOSIS — Z3A32 32 weeks gestation of pregnancy: Secondary | ICD-10-CM

## 2019-08-25 DIAGNOSIS — Z3480 Encounter for supervision of other normal pregnancy, unspecified trimester: Secondary | ICD-10-CM

## 2019-08-25 NOTE — Progress Notes (Signed)
   [place] VIRTUAL VIDEO VISIT ENCOUNTER NOTE  Provider location: [facility] for [facility] at [place]   I connected with [name] on 08/25/19 at  8:30 AM EDT by MyChart Video Encounter at home and verified that I am speaking with the correct person using two identifiers.   I discussed the limitations, risks, security and privacy concerns of performing an evaluation and management service virtually and the availability of in person appointments. I also discussed with the patient that there may be a patient responsible charge related to this service. The patient expressed understanding and agreed to proceed. Subjective:  Rukaya Kleinschmidt is a 28 y.o. G2P1001 at [redacted]w[redacted]d being seen today for ongoing prenatal care.  She is currently monitored for the following issues for this low-risk pregnancy and has Supervision of other normal pregnancy, antepartum and Late prenatal care affecting pregnancy in second trimester on their problem list.  Patient reports trouble staying asleep at night, feels tired.  Contractions: Not present. Vag. Bleeding: None.  Movement: Present. Denies any leaking of fluid.   The following portions of the patient's history were reviewed and updated as appropriate: allergies, current medications, past family history, past medical history, past social history, past surgical history and problem list.   Objective:  BP: 118/78  Fetal Status:     Movement: Present     General:  Alert, oriented and cooperative. Patient is in no acute distress.  Respiratory: Normal respiratory effort, no problems with respiration noted  Mental Status: Normal mood and affect. Normal behavior. Normal judgment and thought content.  Rest of physical exam deferred due to type of encounter  Imaging: No results found.  Assessment and Plan:  Pregnancy: G2P1001 at [redacted]w[redacted]d 1. Supervision of other normal pregnancy, antepartum - anticipatory guidance  2. [place] -  discussed good sleep hygeine - warm bath - chamomile tea  Preterm labor symptoms and general obstetric precautions including but not limited to vaginal bleeding, contractions, leaking of fluid and fetal movement were reviewed in detail with the patient. I discussed the assessment and treatment plan with the patient. The patient was provided an opportunity to ask questions and all were answered. The patient agreed with the plan and demonstrated an understanding of the instructions. The patient was advised to call back or seek an in-person office evaluation/go to MAU at [facility] for any urgent or concerning symptoms. Please refer to After Visit Summary for other counseling recommendations.   I provided 15 minutes of face-to-face time during this encounter.  Return in about 2 weeks (around 09/08/2019).  Future Appointments  Date Time Provider [place]  09/08/2019  9:00 AM [place], [place], [place] for [employer], [place]

## 2019-08-25 NOTE — Progress Notes (Signed)
Pt to take BP and Wt to be documented

## 2019-09-08 ENCOUNTER — Other Ambulatory Visit: Payer: Self-pay

## 2019-09-08 ENCOUNTER — Telehealth (INDEPENDENT_AMBULATORY_CARE_PROVIDER_SITE_OTHER): Payer: BC Managed Care – PPO

## 2019-09-08 DIAGNOSIS — Z3480 Encounter for supervision of other normal pregnancy, unspecified trimester: Secondary | ICD-10-CM

## 2019-09-08 DIAGNOSIS — Z3A34 34 weeks gestation of pregnancy: Secondary | ICD-10-CM

## 2019-09-08 DIAGNOSIS — Z3483 Encounter for supervision of other normal pregnancy, third trimester: Secondary | ICD-10-CM

## 2019-09-08 NOTE — Progress Notes (Signed)
   TELEHEALTH VIRTUAL OBSTETRICS VISIT ENCOUNTER NOTE  I connected with Danielle Smith on 09/08/19 at  9:00 AM EDT by MyChart Virtual Visit at home and verified that I am speaking with the correct person using two identifiers.   I discussed the limitations, risks, security and privacy concerns of performing an evaluation and management service by telephone and the availability of in person appointments. I also discussed with the patient that there may be a patient responsible charge related to this service. The patient expressed understanding and agreed to proceed.  Subjective:  Danielle Smith is a 28 y.o. G2P1001 at [redacted]w[redacted]d being followed for ongoing prenatal care.  She is currently monitored for the following issues for this low-risk pregnancy and has Supervision of other normal pregnancy, antepartum and Late prenatal care affecting pregnancy in second trimester on their problem list.  Patient reports no complaints. Reports fetal movement. Denies any contractions, bleeding or leaking of fluid.   The following portions of the patient's history were reviewed and updated as appropriate: allergies, current medications, past family history, past medical history, past social history, past surgical history and problem list.   Objective:   General:  Alert, oriented and cooperative.   Mental Status: Normal mood and affect perceived. Normal judgment and thought content.  Rest of physical exam deferred due to type of encounter  Assessment and Plan:  Pregnancy: G2P1001 at [redacted]w[redacted]d 1. Supervision of other normal pregnancy, antepartum -BP 104/74 -Discussed where to go at [facility] and visitor policies -Counseled patient on routine COVID-19 testing for all admission to inpatient units whether direct admit or from MAU/ED. Reviewed reasons for testing including identifying cases and protecting patients/staff from possible infection. Reassured patient that separation from newborn is not required for COVID+ tests in  asymptomatic patients and that the plan of care will be created with Peds through shared decision-making. One support person is allowed for all admitted patients regardless of COVID test results. All patient questions answered.   -Anticipatory guidance of next visit reviewed with patient including GBS testing  Preterm labor symptoms and general obstetric precautions including but not limited to vaginal bleeding, contractions, leaking of fluid and fetal movement were reviewed in detail with the patient.  I discussed the assessment and treatment plan with the patient. The patient was provided an opportunity to ask questions and all were answered. The patient agreed with the plan and demonstrated an understanding of the instructions. The patient was advised to call back or seek an in-person office evaluation/go to MAU at [facility] for any urgent or concerning symptoms. Please refer to After Visit Summary for other counseling recommendations.   I provided 20 minutes of non-face-to-face time during this encounter.  Return in about 2 weeks (around 09/22/2019) for Return OB visit.  No future appointments.  Wende Mott, [place] for [employer], [employer]

## 2019-09-22 ENCOUNTER — Ambulatory Visit (INDEPENDENT_AMBULATORY_CARE_PROVIDER_SITE_OTHER): Payer: BC Managed Care – PPO | Admitting: Obstetrics and Gynecology

## 2019-09-22 ENCOUNTER — Other Ambulatory Visit: Payer: Self-pay

## 2019-09-22 ENCOUNTER — Other Ambulatory Visit: Payer: Self-pay | Admitting: Obstetrics and Gynecology

## 2019-09-22 VITALS — BP 109/74 | HR 97 | Wt 219.0 lb

## 2019-09-22 DIAGNOSIS — Z3480 Encounter for supervision of other normal pregnancy, unspecified trimester: Secondary | ICD-10-CM

## 2019-09-22 DIAGNOSIS — Z113 Encounter for screening for infections with a predominantly sexual mode of transmission: Secondary | ICD-10-CM | POA: Diagnosis not present

## 2019-09-22 DIAGNOSIS — B373 Candidiasis of vulva and vagina: Secondary | ICD-10-CM

## 2019-09-22 DIAGNOSIS — O98813 Other maternal infectious and parasitic diseases complicating pregnancy, third trimester: Secondary | ICD-10-CM

## 2019-09-22 DIAGNOSIS — B3731 Acute candidiasis of vulva and vagina: Secondary | ICD-10-CM

## 2019-09-22 DIAGNOSIS — Z3A36 36 weeks gestation of pregnancy: Secondary | ICD-10-CM

## 2019-09-22 MED ORDER — TERCONAZOLE 0.4 % VA CREA: 1.0000 | TOPICAL_CREAM | Freq: Every day | VAGINAL | 0 refills | Status: DC

## 2019-09-22 NOTE — Progress Notes (Signed)
   PRENATAL VISIT NOTE  Subjective:  Danielle Smith is a 28 y.o. G2P1001 at [redacted]w[redacted]d being seen today for ongoing prenatal care.  She is currently monitored for the following issues for this low-risk pregnancy and has Supervision of other normal pregnancy, antepartum; Late prenatal care affecting pregnancy in second trimester; and Vaginal yeast infection on their problem list.  Patient reports no complaints.  Contractions: Not present. Vag. Bleeding: None.  Movement: Present. Denies leaking of fluid.   The following portions of the patient's history were reviewed and updated as appropriate: allergies, current medications, past family history, past medical history, past social history, past surgical history and problem list.   Objective:   Vitals:   09/22/19 0934  BP: 109/74  Pulse: 97  Weight: 219 lb (99.3 kg)    Fetal Status:   Fundal Height: 36 cm Movement: Present  Presentation: Vertex  General:  Alert, oriented and cooperative. Patient is in no acute distress.  Skin: Skin is warm and dry. No rash noted.   Cardiovascular: Normal heart rate noted  Respiratory: Normal respiratory effort, no problems with respiration noted  Abdomen: Soft, gravid, appropriate for gestational age.  Pain/Pressure: Absent     Pelvic: Cervical exam performed Dilation: 1 Effacement (%): Thick Station: -3  Extremities: Normal range of motion.  Edema: Trace  Mental Status: Normal mood and affect. Normal behavior. Normal judgment and thought content.   Assessment and Plan:  Pregnancy: G2P1001 at [redacted]w[redacted]d 1. Supervision of other normal pregnancy, antepartum  - Culture, beta strep (group b only) - Cervicovaginal ancillary only( [place])  Preterm labor symptoms and general obstetric precautions including but not limited to vaginal bleeding, contractions, leaking of fluid and fetal movement were reviewed in detail with the patient. Please refer to After Visit Summary for other counseling recommendations.    Return in about 1 week (around 09/29/2019), or for virtual, 2 weeks for in person.  Future Appointments  Date Time Provider [place]  09/29/2019  9:15 AM Layann Bluett, Artist Pais, NP [facility]  10/06/2019 10:30 AM [place]sea Mall, CNM [facility]    [name], NP

## 2019-09-23 LAB — CERVICOVAGINAL ANCILLARY ONLY
Chlamydia: NEGATIVE
Neisseria Gonorrhea: NEGATIVE

## 2019-09-25 LAB — CULTURE, BETA STREP (GROUP B ONLY)
MICRO NUMBER:: 923285
SPECIMEN QUALITY:: ADEQUATE

## 2019-09-29 ENCOUNTER — Telehealth (INDEPENDENT_AMBULATORY_CARE_PROVIDER_SITE_OTHER): Payer: BC Managed Care – PPO | Admitting: Obstetrics and Gynecology

## 2019-09-29 ENCOUNTER — Encounter: Payer: Self-pay | Admitting: Obstetrics and Gynecology

## 2019-09-29 ENCOUNTER — Other Ambulatory Visit: Payer: Self-pay

## 2019-09-29 DIAGNOSIS — Z3483 Encounter for supervision of other normal pregnancy, third trimester: Secondary | ICD-10-CM

## 2019-09-29 DIAGNOSIS — Z3A37 37 weeks gestation of pregnancy: Secondary | ICD-10-CM

## 2019-09-29 DIAGNOSIS — Z3480 Encounter for supervision of other normal pregnancy, unspecified trimester: Secondary | ICD-10-CM

## 2019-09-29 NOTE — Patient Instructions (Addendum)
Cervical Ripening: May try one or both  Red Raspberry Leaf capsules:  two 300mg or 400mg tablets with each meal, 2-3 times a day  Potential Side Effects Of Raspberry Leaf:  Most women do not experience any side effects from drinking raspberry leaf tea. However, nausea and loose stools are possible     Evening Primrose Oil capsules: may take 1 to 3 capsules daily. May also prick one to release the oil and insert it into your vagina at night.  Some of the potential side effects:  Upset stomach  Loose stools or diarrhea  Headaches  Nausea:     The Miles[employer]uit  This circuit takes at least 90 minutes to complete so clear your schedule and make mental preparations so you can relax in your environment. The second step requires a lot of pillows so gather them up before beginning Before starting, you should empty your bladder! Have a nice drink nearby, and make sure it has a straw! If you are having contractions, this circuit should be done through contractions, try not to change positions between steps Before you begin...  "I named this 'circuit' after my friend Megan Miles, who shared and discussed it with me when I was working with a client whose labor seemed to be stalled out and no longer progressing... This circuit is useful to help get the baby lined up, ideally, in the "Left Occiput Anterior" (LOA) Position, both before labor begins and when some corrections need to be done during labor. Prenatally, this position set can help to rotate a baby. As a natural method of induction, this can help get things going if baby just needed a gentle nudge of position to set things off. To the best of my knowledge, this group of positions will not "hurt" a baby that is already lined up correctly." - Sharon Muza   Step One: Open-knee Chest Stay in this position for 30 minutes, start in cat/cow, then drop your chest as low as you can to the bed or the floor and your bottom as high as  you can. Knees should be fairly wide apart, and the angle between the torso/thighs should be wider than 90 degrees. Wiggle around, prop with lots of pillows and use this time to get totally relaxed. This position allows the baby to scoot out of the pelvis a bit and gives them room to rotate, shift their head position, etc. If the pregnant person finds it helpful, careful positioning with a rebozo under the belly, with gentle tension from a support person behind can help maintain this position for the full 30 minutes.  Step Two:Exaggerated Left Side Lying Roll to your left side, bringing your top leg as high as possible and keeping your bottom leg straight. Roll forward as much as possible, again using a lot of pillows. Sink into the bed and relax some more. If you fall asleep, that's totally okay and you can stay there! If not, stay here for at least another half an hour. Try and get your top right leg up towards your head and get as rolled over onto your belly as much as possible. If you repeat the circuit during labor, try alternating left and right sides. We know the photo the left is actually right side... just flip the image in your head.  Step Three: Moving and Lunges Lunge, walk stairs facing sideways, 2 at a time, (have a spotter downstairs of you!), take a walk outside with one foot on the curb and the   other on the street, sit on a birth ball and hula- anything that's upright and putting your pelvis in open, asymmetrical positions. Spend at least 30 minutes doing this one as well to give your baby a chance to move down. If you are lunging or stair or curb walking, you should lunge/walk/go up stairs in the direction that feels better to you. The key with the lunge is that the toes of the higher leg and mom's belly button should be at right angles. Do not lunge over your knee, that closes the pelvis.     Megan Hamilton Miles: Circuit Creator -  [email or web address] Sharon Muza, CD, BDT (DONA), LCCE, FACCE: Supporting Content - [email or web address] Emily Weaver Brown: Photography - [email or web address] Kate Dewey CD/CDT (BAI): Print and Webmaster - [email or web address] [employer] Masterminds [employer] Miles [employer] [email or web address] 

## 2019-09-29 NOTE — Progress Notes (Signed)
   TELEHEALTH VIRTUAL OBSTETRICS VISIT ENCOUNTER NOTE  I connected with Danielle Smith on 09/29/19 at  9:15 AM EDT by telephone at home and verified that I am speaking with the correct person using two identifiers.   I discussed the limitations, risks, security and privacy concerns of performing an evaluation and management service by telephone and the availability of in person appointments. I also discussed with the patient that there may be a patient responsible charge related to this service. The patient expressed understanding and agreed to proceed.  Subjective:  Danielle Smith is a 28 y.o. G2P1001 at [redacted]w[redacted]d being followed for ongoing prenatal care.  She is currently monitored for the following issues for this low-risk pregnancy and has Supervision of other normal pregnancy, antepartum; Late prenatal care affecting pregnancy in second trimester; and Vaginal yeast infection on their problem list.  Patient reports no complaints. Reports fetal movement. Denies any contractions, bleeding or leaking of fluid.   The following portions of the patient's history were reviewed and updated as appropriate: allergies, current medications, past family history, past medical history, past social history, past surgical history and problem list.   Objective:   General:  Alert, oriented and cooperative.   Mental Status: Normal mood and affect perceived. Normal judgment and thought content.  Rest of physical exam deferred due to type of encounter  Assessment and Plan:  Pregnancy: G2P1001 at [redacted]w[redacted]d 1. Supervision of other normal pregnancy, antepartum  BP 115/89 Discussed cervical ripening. In-person visit next week GBS negative   Term labor symptoms and general obstetric precautions including but not limited to vaginal bleeding, contractions, leaking of fluid and fetal movement were reviewed in detail with the patient.  I discussed the assessment and treatment plan with the patient. The patient was  provided an opportunity to ask questions and all were answered. The patient agreed with the plan and demonstrated an understanding of the instructions. The patient was advised to call back or seek an in-person office evaluation/go to [facility] at [facility] for any urgent or concerning symptoms. Please refer to After Visit Summary for other counseling recommendations.   I provided 10 minutes of non-face-to-face time during this encounter.  No follow-ups on file.  Future Appointments  Date Time Provider [place]  10/06/2019 10:30 AM Tresea Mall, CNM [facility]    [name], NP [facility] [place] [place]

## 2019-10-05 ENCOUNTER — Inpatient Hospital Stay (HOSPITAL_COMMUNITY)
Admission: AD | Admit: 2019-10-05 | Discharge: 2019-10-05 | Disposition: A | Discharge status: Home / Self Care | Payer: BC Managed Care – PPO | Source: Ambulatory Visit | Attending: Obstetrics & Gynecology | Admitting: Obstetrics & Gynecology

## 2019-10-05 ENCOUNTER — Other Ambulatory Visit: Payer: Self-pay

## 2019-10-05 ENCOUNTER — Encounter (HOSPITAL_COMMUNITY): Payer: Self-pay

## 2019-10-05 DIAGNOSIS — Z3A38 38 weeks gestation of pregnancy: Secondary | ICD-10-CM | POA: Insufficient documentation

## 2019-10-05 DIAGNOSIS — O471 False labor at or after 37 completed weeks of gestation: Secondary | ICD-10-CM

## 2019-10-05 HISTORY — DX: Other specified health status: Z78.9

## 2019-10-05 NOTE — MAU Note (Signed)
Pt reports to MAU c/o ctx every 2.61min lasting for about 9min. No bleeding or LOF. +FM.

## 2019-10-05 NOTE — Discharge Instructions (Signed)
Braxton Hicks Contractions Contractions of the uterus can occur throughout pregnancy, but they are not always a sign that you are in labor. You may have practice contractions called Braxton Hicks contractions. These false labor contractions are sometimes confused with true labor. What are Braxton Hicks contractions? Braxton Hicks contractions are tightening movements that occur in the muscles of the uterus before labor. Unlike true labor contractions, these contractions do not result in opening (dilation) and thinning of the cervix. Toward the end of pregnancy (32-34 weeks), Braxton Hicks contractions can happen more often and may become stronger. These contractions are sometimes difficult to tell apart from true labor because they can be very uncomfortable. You should not feel embarrassed if you go to the hospital with false labor. Sometimes, the only way to tell if you are in true labor is for your health care provider to look for changes in the cervix. The health care provider will do a physical exam and may monitor your contractions. If you are not in true labor, the exam should show that your cervix is not dilating and your water has not broken. If there are no other health problems associated with your pregnancy, it is completely safe for you to be sent home with false labor. You may continue to have Braxton Hicks contractions until you go into true labor. How to tell the difference between true labor and false labor True labor  Contractions last 30-70 seconds.  Contractions become very regular.  Discomfort is usually felt in the top of the uterus, and it spreads to the lower abdomen and low back.  Contractions do not go away with walking.  Contractions usually become more intense and increase in frequency.  The cervix dilates and gets thinner. False labor  Contractions are usually shorter and not as strong as true labor contractions.  Contractions are usually irregular.  Contractions  are often felt in the front of the lower abdomen and in the groin.  Contractions may go away when you walk around or change positions while lying down.  Contractions get weaker and are shorter-lasting as time goes on.  The cervix usually does not dilate or become thin. Follow these instructions at home:   Take over-the-counter and prescription medicines only as told by your health care provider.  Keep up with your usual exercises and follow other instructions from your health care provider.  Eat and drink lightly if you think you are going into labor.  If Braxton Hicks contractions are making you uncomfortable: ? Change your position from lying down or resting to walking, or change from walking to resting. ? Sit and rest in a tub of warm water. ? Drink enough fluid to keep your urine pale yellow. Dehydration may cause these contractions. ? Do slow and deep breathing several times an hour.  Keep all follow-up prenatal visits as told by your health care provider. This is important. Contact a health care provider if:  You have a fever.  You have continuous pain in your abdomen. Get help right away if:  Your contractions become stronger, more regular, and closer together.  You have fluid leaking or gushing from your vagina.  You pass blood-tinged mucus (bloody show).  You have bleeding from your vagina.  You have low back pain that you never had before.  You feel your baby's head pushing down and causing pelvic pressure.  Your baby is not moving inside you as much as it used to. Summary  Contractions that occur before labor are   called Braxton Hicks contractions, false labor, or practice contractions.  Braxton Hicks contractions are usually shorter, weaker, farther apart, and less regular than true labor contractions. True labor contractions usually become progressively stronger and regular, and they become more frequent.  Manage discomfort from Braxton Hicks contractions  by changing position, resting in a warm bath, drinking plenty of water, or practicing deep breathing. This information is not intended to replace advice given to you by your health care provider. Make sure you discuss any questions you have with your health care provider. Document Released: 04/29/2017 Document Revised: 11/26/2017 Document Reviewed: 04/29/2017 Elsevier Patient Education  2020 Elsevier Inc.  

## 2019-10-05 NOTE — MAU Provider Note (Signed)
Danielle Smith is a 28 y.o. G59P1001 female at [redacted]w[redacted]d  RN Labor check, not seen by provider SVE by RN: Dilation: 1.5 Effacement (%): Thick Station: Ballotable Exam by:: benji Production designer, theatre/television/film NST: FHR baseline 145 bpm, Variability: moderate, Accelerations:present, Decelerations:  Absent= Cat 1/Reactive Toco: regular, every 3 minutes  D/C home  Myrtis Ser CNM 10/05/2019 4:18 AM

## 2019-10-06 ENCOUNTER — Ambulatory Visit (INDEPENDENT_AMBULATORY_CARE_PROVIDER_SITE_OTHER): Payer: BC Managed Care – PPO | Admitting: Advanced Practice Midwife

## 2019-10-06 ENCOUNTER — Encounter: Payer: Self-pay | Admitting: Advanced Practice Midwife

## 2019-10-06 VITALS — BP 123/79 | HR 109 | Wt 225.0 lb

## 2019-10-06 DIAGNOSIS — Z23 Encounter for immunization: Secondary | ICD-10-CM

## 2019-10-06 DIAGNOSIS — Z3A38 38 weeks gestation of pregnancy: Secondary | ICD-10-CM

## 2019-10-06 DIAGNOSIS — Z3480 Encounter for supervision of other normal pregnancy, unspecified trimester: Secondary | ICD-10-CM

## 2019-10-06 DIAGNOSIS — Z3483 Encounter for supervision of other normal pregnancy, third trimester: Secondary | ICD-10-CM

## 2019-10-06 NOTE — Progress Notes (Signed)
   PRENATAL VISIT NOTE  Subjective:  Danielle Smith is a 28 y.o. G2P1001 at [redacted]w[redacted]d being seen today for ongoing prenatal care.  She is currently monitored for the following issues for this low-risk pregnancy and has Supervision of other normal pregnancy, antepartum; Late prenatal care affecting pregnancy in second trimester; and Vaginal yeast infection on their problem list.  Patient reports no complaints.  Contractions: Not present. Vag. Bleeding: None.  Movement: Present. Denies leaking of fluid.   The following portions of the patient's history were reviewed and updated as appropriate: allergies, current medications, past family history, past medical history, past social history, past surgical history and problem list.   Objective:   Vitals:   10/06/19 1024  BP: 123/79  Pulse: (!) 109  Weight: 225 lb (102.1 kg)    Fetal Status: Fetal Heart Rate (bpm): 147   Movement: Present     General:  Alert, oriented and cooperative. Patient is in no acute distress.  Skin: Skin is warm and dry. No rash noted.   Cardiovascular: Normal heart rate noted  Respiratory: Normal respiratory effort, no problems with respiration noted  Abdomen: Soft, gravid, appropriate for gestational age.  Pain/Pressure: Present     Pelvic: Cervical exam performed Dilation: 1 Effacement (%): Thick Station: Ballotable  Extremities: Normal range of motion.  Edema: Trace  Mental Status: Normal mood and affect. Normal behavior. Normal judgment and thought content.   Assessment and Plan:  Pregnancy: G2P1001 at [redacted]w[redacted]d 1. Supervision of other normal pregnancy, antepartum - routine care  Term labor symptoms and general obstetric precautions including but not limited to vaginal bleeding, contractions, leaking of fluid and fetal movement were reviewed in detail with the patient. Please refer to After Visit Summary for other counseling recommendations.   Return in about 1 week (around 10/13/2019) for in person visit .  No  future appointments.  Marcille Buffy DNP, CNM  10/06/19  10:42 AM

## 2019-10-13 ENCOUNTER — Other Ambulatory Visit: Payer: Self-pay

## 2019-10-13 ENCOUNTER — Ambulatory Visit (INDEPENDENT_AMBULATORY_CARE_PROVIDER_SITE_OTHER): Payer: BC Managed Care – PPO | Admitting: Obstetrics and Gynecology

## 2019-10-13 VITALS — BP 130/80 | HR 97 | Wt 232.0 lb

## 2019-10-13 DIAGNOSIS — Z3480 Encounter for supervision of other normal pregnancy, unspecified trimester: Secondary | ICD-10-CM

## 2019-10-13 DIAGNOSIS — Z3A39 39 weeks gestation of pregnancy: Secondary | ICD-10-CM

## 2019-10-13 DIAGNOSIS — Z3483 Encounter for supervision of other normal pregnancy, third trimester: Secondary | ICD-10-CM

## 2019-10-13 NOTE — Patient Instructions (Signed)
The Miles[employer]uit  This circuit takes at least 90 minutes to complete so clear your schedule and make mental preparations so you can relax in your environment. The second step requires a lot of pillows so gather them up before beginning Before starting, you should empty your bladder! Have a nice drink nearby, and make sure it has a straw! If you are having contractions, this circuit should be done through contractions, try not to change positions between steps Before you begin...  "I named this 'circuit' after my friend Danielle Smith, who shared and discussed it with me when I was working with a client whose labor seemed to be stalled out and no longer progressing... This circuit is useful to help get the baby lined up, ideally, in the "Left Occiput Anterior" (LOA) Position, both before labor begins and when some corrections need to be done during labor. Prenatally, this position set can help to rotate a baby. As a natural method of induction, this can help get things going if baby just needed a gentle nudge of position to set things off. To the best of my knowledge, this group of positions will not "hurt" a baby that is already lined up correctly." - Danielle Smith   Step One: Open-knee Chest Stay in this position for 30 minutes, start in cat/cow, then drop your chest as low as you can to the bed or the floor and your bottom as high as you can. Knees should be fairly wide apart, and the angle between the torso/thighs should be wider than 90 degrees. Wiggle around, prop with lots of pillows and use this time to get totally relaxed. This position allows the baby to scoot out of the pelvis a bit and gives them room to rotate, shift their head position, etc. If the pregnant person finds it helpful, careful positioning with a rebozo under the belly, with gentle tension from a support person behind can help maintain this position for the full 30 minutes.  Step Two:Exaggerated Left Side  Lying Roll to your left side, bringing your top leg as high as possible and keeping your bottom leg straight. Roll forward as much as possible, again using a lot of pillows. Sink into the bed and relax some more. If you fall asleep, that's totally okay and you can stay there! If not, stay here for at least another half an hour. Try and get your top right leg up towards your head and get as rolled over onto your belly as much as possible. If you repeat the circuit during labor, try alternating left and right sides. We know the photo the left is actually right side... just flip the image in your head.  Step Three: Moving and Lunges Lunge, walk stairs facing sideways, 2 at a time, (have a spotter downstairs of you!), take a walk outside with one foot on the curb and the other on the street, sit on a birth ball and hula- anything that's upright and putting your pelvis in open, asymmetrical positions. Spend at least 30 minutes doing this one as well to give your baby a chance to move down. If you are lunging or stair or curb walking, you should lunge/walk/go up stairs in the direction that feels better to you. The key with the lunge is that the toes of the higher leg and mom's belly button should be at right angles. Do not lunge over your knee, that closes the pelvis.     Danielle Hamilton Smith: Circuit Creator - [email or web address] Danielle   Smith, CD, BDT (DONA), LCCE, FACCE: Supporting Content - [email or web address] Emily Weaver Brown: Photography - [email or web address] Kate Dewey CD/CDT (BAI): Print and Webmaster - [email or web address] [employer] Master[employer] [email or web address]  

## 2019-10-13 NOTE — Progress Notes (Signed)
   PRENATAL VISIT NOTE  Subjective:  Danielle Smith is a 28 y.o. G2P1001 at [redacted]w[redacted]d being seen today for ongoing prenatal care.  She is currently monitored for the following issues for this low-risk pregnancy and has Supervision of other normal pregnancy, antepartum; Late prenatal care affecting pregnancy in second trimester; and Vaginal yeast infection on their problem list.  Patient reports Swelling in bilateral feet.  Contractions: Irregular. Vag. Bleeding: None.  Movement: Present. Denies leaking of fluid.   The following portions of the patient's history were reviewed and updated as appropriate: allergies, current medications, past family history, past medical history, past social history, past surgical history and problem list.   Objective:   Vitals:   10/13/19 1051  BP: 130/80  Pulse: 97  Weight: 232 lb (105.2 kg)    Fetal Status: Fetal Heart Rate (bpm): 155 Fundal Height: 38 cm Movement: Present  Presentation: Vertex  General:  Alert, oriented and cooperative. Patient is in no acute distress.  Skin: Skin is warm and dry. No rash noted.   Cardiovascular: Normal heart rate noted  Respiratory: Normal respiratory effort, no problems with respiration noted  Abdomen: Soft, gravid, appropriate for gestational age.  Pain/Pressure: Present     Pelvic: Cervical exam performed Dilation: 2 Effacement (%): Thick Station: Ballotable  Extremities: Normal range of motion.  Edema: Mild pitting, slight indentation  Mental Status: Normal mood and affect. Normal behavior. Normal judgment and thought content.   Assessment and Plan:  Pregnancy: G2P1001 at [redacted]w[redacted]d  1. Supervision of other normal pregnancy, antepartum  Doing well, anxious about when labor will start Using primrose oil Miles circuit discussed BP good today BPP & NST next week.  Not interested in induction at this time.    There are no diagnoses linked to this encounter. Term labor symptoms and general obstetric precautions  including but not limited to vaginal bleeding, contractions, leaking of fluid and fetal movement were reviewed in detail with the patient. Please refer to After Visit Summary for other counseling recommendations.   Return in about 1 week (around 10/20/2019), or for BPP and NST- post dates..  No future appointments.  [name], NP

## 2019-10-20 ENCOUNTER — Encounter (HOSPITAL_COMMUNITY): Payer: Self-pay | Admitting: *Deleted

## 2019-10-20 ENCOUNTER — Telehealth (HOSPITAL_COMMUNITY): Payer: Self-pay | Admitting: *Deleted

## 2019-10-20 ENCOUNTER — Other Ambulatory Visit: Payer: Self-pay | Admitting: Advanced Practice Midwife

## 2019-10-20 ENCOUNTER — Other Ambulatory Visit: Payer: Self-pay | Admitting: Certified Nurse Midwife

## 2019-10-20 ENCOUNTER — Ambulatory Visit (INDEPENDENT_AMBULATORY_CARE_PROVIDER_SITE_OTHER): Payer: BC Managed Care – PPO | Admitting: Certified Nurse Midwife

## 2019-10-20 ENCOUNTER — Other Ambulatory Visit: Payer: Self-pay

## 2019-10-20 VITALS — BP 128/79 | HR 115 | Temp 98.7°F | Wt 232.0 lb

## 2019-10-20 DIAGNOSIS — Z3A40 40 weeks gestation of pregnancy: Secondary | ICD-10-CM

## 2019-10-20 DIAGNOSIS — O480 Post-term pregnancy: Secondary | ICD-10-CM | POA: Diagnosis not present

## 2019-10-20 DIAGNOSIS — Z3480 Encounter for supervision of other normal pregnancy, unspecified trimester: Secondary | ICD-10-CM

## 2019-10-20 NOTE — Patient Instructions (Signed)

## 2019-10-20 NOTE — Progress Notes (Signed)
Subjective:  Danielle Smith is a 28 y.o. G2P1001 at [redacted]w[redacted]d being seen today for ongoing prenatal care.  She is currently monitored for the following issues for this low-risk pregnancy and has Supervision of other normal pregnancy, antepartum; Late prenatal care affecting pregnancy in second trimester; and Vaginal yeast infection on their problem list.  Patient reports no complaints.  Contractions: Irregular. Vag. Bleeding: None.  Movement: Present. Denies leaking of fluid.   The following portions of the patient's history were reviewed and updated as appropriate: allergies, current medications, past family history, past medical history, past social history, past surgical history and problem list. Problem list updated.  Objective:   Vitals:   10/20/19 0818  BP: 128/79  Pulse: (!) 115  Temp: 98.7 F (37.1 C)  Weight: 232 lb (105.2 kg)    Fetal Status: Fetal Heart Rate (bpm): 150   Movement: Present  Presentation: Vertex  General:  Alert, oriented and cooperative. Patient is in no acute distress.  Skin: Skin is warm and dry. No rash noted.   Cardiovascular: Normal heart rate noted  Respiratory: Normal respiratory effort, no problems with respiration noted  Abdomen: Soft, gravid, appropriate for gestational age. Pain/Pressure: Present     Pelvic: Vag. Bleeding: None Vag D/C Character: Thin   Cervical exam performed Dilation: 3 Effacement (%): 60 Station: -2  Extremities: Normal range of motion.  Edema: Mild pitting, slight indentation  Mental Status: Normal mood and affect. Normal behavior. Normal judgment and thought content.   Urinalysis:      Assessment and Plan:  Pregnancy: G2P1001 at [redacted]w[redacted]d  1. Supervision of other normal pregnancy, antepartum - consented for membrane sweep, performed, tolerated well  2. Post-term pregnancy, 40-42 weeks of gestation - plan for IOL 10/28 - NST reactive, AFI 16 cm   Term labor symptoms and general obstetric precautions including but not limited  to vaginal bleeding, contractions, leaking of fluid and fetal movement were reviewed in detail with the patient. Please refer to After Visit Summary for other counseling recommendations.  No follow-ups on file.   Julianne Handler, CNM

## 2019-10-20 NOTE — Telephone Encounter (Signed)
Preadmission screen  

## 2019-10-23 ENCOUNTER — Other Ambulatory Visit (HOSPITAL_COMMUNITY)
Admission: RE | Admit: 2019-10-23 | Discharge: 2019-10-23 | Disposition: A | Discharge status: Home / Self Care | Payer: BC Managed Care – PPO | Source: Ambulatory Visit | Attending: Obstetrics and Gynecology | Admitting: Obstetrics and Gynecology

## 2019-10-23 ENCOUNTER — Other Ambulatory Visit: Payer: Self-pay

## 2019-10-23 DIAGNOSIS — Z20828 Contact with and (suspected) exposure to other viral communicable diseases: Secondary | ICD-10-CM | POA: Insufficient documentation

## 2019-10-23 DIAGNOSIS — Z01812 Encounter for preprocedural laboratory examination: Secondary | ICD-10-CM | POA: Insufficient documentation

## 2019-10-23 LAB — SARS CORONAVIRUS 2 (TAT 6-24 HRS): SARS Coronavirus 2: NEGATIVE

## 2019-10-23 NOTE — MAU Note (Signed)
Covid swab collected. Pt tolerated well. Pt states she has a cough but has not had a temp or any other symptoms. States she has allergies

## 2019-10-25 ENCOUNTER — Inpatient Hospital Stay (HOSPITAL_COMMUNITY): Payer: BC Managed Care – PPO | Admitting: Anesthesiology

## 2019-10-25 ENCOUNTER — Encounter (HOSPITAL_COMMUNITY): Payer: Self-pay | Admitting: *Deleted

## 2019-10-25 ENCOUNTER — Other Ambulatory Visit: Payer: Self-pay

## 2019-10-25 ENCOUNTER — Inpatient Hospital Stay (HOSPITAL_COMMUNITY)
Admission: AD | Admit: 2019-10-25 | Discharge: 2019-10-27 | DRG: 807 | Disposition: A | Discharge status: Home / Self Care | Payer: BC Managed Care – PPO | Attending: Family Medicine | Admitting: Family Medicine

## 2019-10-25 ENCOUNTER — Inpatient Hospital Stay (HOSPITAL_COMMUNITY): Payer: BC Managed Care – PPO

## 2019-10-25 DIAGNOSIS — D649 Anemia, unspecified: Secondary | ICD-10-CM | POA: Diagnosis present

## 2019-10-25 DIAGNOSIS — Z3A41 41 weeks gestation of pregnancy: Secondary | ICD-10-CM | POA: Diagnosis not present

## 2019-10-25 DIAGNOSIS — O0932 Supervision of pregnancy with insufficient antenatal care, second trimester: Secondary | ICD-10-CM

## 2019-10-25 DIAGNOSIS — O9902 Anemia complicating childbirth: Secondary | ICD-10-CM | POA: Diagnosis not present

## 2019-10-25 DIAGNOSIS — O99019 Anemia complicating pregnancy, unspecified trimester: Secondary | ICD-10-CM

## 2019-10-25 DIAGNOSIS — Z3480 Encounter for supervision of other normal pregnancy, unspecified trimester: Principal | ICD-10-CM

## 2019-10-25 DIAGNOSIS — O480 Post-term pregnancy: Secondary | ICD-10-CM | POA: Diagnosis not present

## 2019-10-25 LAB — CBC
HCT: 30.8 % — ABNORMAL LOW (ref 36.0–46.0)
Hemoglobin: 9.8 g/dL — ABNORMAL LOW (ref 12.0–15.0)
MCH: 25.9 pg — ABNORMAL LOW (ref 26.0–34.0)
MCHC: 31.8 g/dL (ref 30.0–36.0)
MCV: 81.3 fL (ref 80.0–100.0)
Platelets: 159 10*3/uL (ref 150–400)
RBC: 3.79 MIL/uL — ABNORMAL LOW (ref 3.87–5.11)
RDW: 13.5 % (ref 11.5–15.5)
WBC: 8.5 10*3/uL (ref 4.0–10.5)
nRBC: 0 % (ref 0.0–0.2)

## 2019-10-25 LAB — ABO/RH: ABO/RH(D): A POS

## 2019-10-25 LAB — TYPE AND SCREEN
ABO/RH(D): A POS
Antibody Screen: NEGATIVE

## 2019-10-25 LAB — RPR: RPR Ser Ql: NONREACTIVE

## 2019-10-25 MED ORDER — SOD CITRATE-CITRIC ACID 500-334 MG/5ML PO SOLN: 30.0000 mL | ORAL | Status: DC | PRN

## 2019-10-25 MED ORDER — PHENYLEPHRINE 40 MCG/ML (10ML) SYRINGE FOR IV PUSH (FOR BLOOD PRESSURE SUPPORT)
PREFILLED_SYRINGE | INTRAVENOUS | Status: AC
  Filled 2019-10-25: qty 10

## 2019-10-25 MED ORDER — ONDANSETRON HCL 4 MG PO TABS: 4.0000 mg | ORAL_TABLET | ORAL | Status: DC | PRN

## 2019-10-25 MED ORDER — PHENYLEPHRINE 40 MCG/ML (10ML) SYRINGE FOR IV PUSH (FOR BLOOD PRESSURE SUPPORT): 80.0000 ug | PREFILLED_SYRINGE | INTRAVENOUS | Status: DC | PRN

## 2019-10-25 MED ORDER — LABETALOL HCL 5 MG/ML IV SOLN
INTRAVENOUS | Status: AC
  Filled 2019-10-25: qty 4

## 2019-10-25 MED ORDER — DIPHENHYDRAMINE HCL 25 MG PO CAPS: 25.0000 mg | ORAL_CAPSULE | Freq: Four times a day (QID) | ORAL | Status: DC | PRN

## 2019-10-25 MED ORDER — LACTATED RINGERS IV SOLN: INTRAVENOUS | Status: DC

## 2019-10-25 MED ORDER — OXYTOCIN 40 UNITS IN NORMAL SALINE INFUSION - SIMPLE MED
2.5000 [IU]/h | INTRAVENOUS | Status: DC
  Filled 2019-10-25: qty 1000

## 2019-10-25 MED ORDER — IBUPROFEN 600 MG PO TABS
600.0000 mg | ORAL_TABLET | Freq: Four times a day (QID) | ORAL | Status: DC
  Administered 2019-10-25 – 2019-10-27 (×7): 600 mg via ORAL
  Filled 2019-10-25 (×7): qty 1

## 2019-10-25 MED ORDER — DIBUCAINE (PERIANAL) 1 % EX OINT: 1.0000 "application " | TOPICAL_OINTMENT | CUTANEOUS | Status: DC | PRN

## 2019-10-25 MED ORDER — OXYTOCIN BOLUS FROM INFUSION
500.0000 mL | Freq: Once | INTRAVENOUS | Status: AC
  Administered 2019-10-25: 500 mL via INTRAVENOUS

## 2019-10-25 MED ORDER — LACTATED RINGERS IV SOLN: 500.0000 mL | Freq: Once | INTRAVENOUS | Status: DC

## 2019-10-25 MED ORDER — ACETAMINOPHEN 325 MG PO TABS: 650.0000 mg | ORAL_TABLET | ORAL | Status: DC | PRN

## 2019-10-25 MED ORDER — ONDANSETRON HCL 4 MG/2ML IJ SOLN: 4.0000 mg | INTRAMUSCULAR | Status: DC | PRN

## 2019-10-25 MED ORDER — LACTATED RINGERS IV SOLN: 500.0000 mL | INTRAVENOUS | Status: DC | PRN

## 2019-10-25 MED ORDER — ZOLPIDEM TARTRATE 5 MG PO TABS: 5.0000 mg | ORAL_TABLET | Freq: Every evening | ORAL | Status: DC | PRN

## 2019-10-25 MED ORDER — BENZOCAINE-MENTHOL 20-0.5 % EX AERO
1.0000 "application " | INHALATION_SPRAY | CUTANEOUS | Status: DC | PRN
  Administered 2019-10-26: 1 via TOPICAL
  Filled 2019-10-25: qty 56

## 2019-10-25 MED ORDER — EPHEDRINE 5 MG/ML INJ: 10.0000 mg | INTRAVENOUS | Status: DC | PRN

## 2019-10-25 MED ORDER — TETANUS-DIPHTH-ACELL PERTUSSIS 5-2.5-18.5 LF-MCG/0.5 IM SUSP: 0.5000 mL | Freq: Once | INTRAMUSCULAR | Status: DC

## 2019-10-25 MED ORDER — PRENATAL MULTIVITAMIN CH
1.0000 | ORAL_TABLET | Freq: Every day | ORAL | Status: DC
  Administered 2019-10-26 – 2019-10-27 (×2): 1 via ORAL
  Filled 2019-10-25 (×2): qty 1

## 2019-10-25 MED ORDER — LIDOCAINE HCL (PF) 1 % IJ SOLN
INTRAMUSCULAR | Status: DC | PRN
  Administered 2019-10-25 (×2): 4 mL via EPIDURAL

## 2019-10-25 MED ORDER — SODIUM CHLORIDE (PF) 0.9 % IJ SOLN
INTRAMUSCULAR | Status: DC | PRN
  Administered 2019-10-25: 12 mL/h via EPIDURAL

## 2019-10-25 MED ORDER — OXYCODONE-ACETAMINOPHEN 5-325 MG PO TABS: 2.0000 | ORAL_TABLET | ORAL | Status: DC | PRN

## 2019-10-25 MED ORDER — OXYTOCIN 40 UNITS IN NORMAL SALINE INFUSION - SIMPLE MED
1.0000 m[IU]/min | INTRAVENOUS | Status: DC
  Administered 2019-10-25: 2 m[IU]/min via INTRAVENOUS

## 2019-10-25 MED ORDER — COCONUT OIL OIL
1.0000 "application " | TOPICAL_OIL | Status: DC | PRN
  Administered 2019-10-26: 1 via TOPICAL

## 2019-10-25 MED ORDER — OXYCODONE-ACETAMINOPHEN 5-325 MG PO TABS: 1.0000 | ORAL_TABLET | ORAL | Status: DC | PRN

## 2019-10-25 MED ORDER — DIPHENHYDRAMINE HCL 50 MG/ML IJ SOLN: 12.5000 mg | INTRAMUSCULAR | Status: DC | PRN

## 2019-10-25 MED ORDER — FENTANYL-BUPIVACAINE-NACL 0.5-0.125-0.9 MG/250ML-% EP SOLN
EPIDURAL | Status: AC
  Filled 2019-10-25: qty 250

## 2019-10-25 MED ORDER — SENNOSIDES-DOCUSATE SODIUM 8.6-50 MG PO TABS
2.0000 | ORAL_TABLET | ORAL | Status: DC
  Administered 2019-10-25 – 2019-10-27 (×2): 2 via ORAL
  Filled 2019-10-25 (×2): qty 2

## 2019-10-25 MED ORDER — WITCH HAZEL-GLYCERIN EX PADS: 1.0000 "application " | MEDICATED_PAD | CUTANEOUS | Status: DC | PRN

## 2019-10-25 MED ORDER — ONDANSETRON HCL 4 MG/2ML IJ SOLN: 4.0000 mg | Freq: Four times a day (QID) | INTRAMUSCULAR | Status: DC | PRN

## 2019-10-25 MED ORDER — LIDOCAINE HCL (PF) 1 % IJ SOLN
30.0000 mL | INTRAMUSCULAR | Status: AC | PRN
  Administered 2019-10-25: 30 mL via SUBCUTANEOUS
  Filled 2019-10-25: qty 30

## 2019-10-25 MED ORDER — TERBUTALINE SULFATE 1 MG/ML IJ SOLN: 0.2500 mg | Freq: Once | INTRAMUSCULAR | Status: DC | PRN

## 2019-10-25 MED ORDER — FENTANYL-BUPIVACAINE-NACL 0.5-0.125-0.9 MG/250ML-% EP SOLN: 12.0000 mL/h | EPIDURAL | Status: DC | PRN

## 2019-10-25 MED ORDER — SIMETHICONE 80 MG PO CHEW: 80.0000 mg | CHEWABLE_TABLET | ORAL | Status: DC | PRN

## 2019-10-25 MED ORDER — PHENYLEPHRINE 40 MCG/ML (10ML) SYRINGE FOR IV PUSH (FOR BLOOD PRESSURE SUPPORT)
80.0000 ug | PREFILLED_SYRINGE | INTRAVENOUS | Status: DC | PRN
  Administered 2019-10-25: 80 ug via INTRAVENOUS

## 2019-10-25 NOTE — Anesthesia Preprocedure Evaluation (Signed)
Anesthesia Evaluation  Patient identified by MRN, date of birth, ID band Patient awake    Reviewed: Allergy & Precautions, H&P , Patient's Chart, lab work & pertinent test results  Airway Mallampati: II  TM Distance: >3 FB Neck ROM: full    Dental no notable dental hx. (+) Teeth Intact   Pulmonary neg pulmonary ROS,    Pulmonary exam normal breath sounds clear to auscultation       Cardiovascular negative cardio ROS Normal cardiovascular exam Rhythm:regular Rate:Normal     Neuro/Psych negative neurological ROS  negative psych ROS   GI/Hepatic negative GI ROS, Neg liver ROS,   Endo/Other  Obesity  Renal/GU negative Renal ROS  negative genitourinary   Musculoskeletal   Abdominal   Peds  Hematology negative hematology ROS (+)   Anesthesia Other Findings   Reproductive/Obstetrics (+) Pregnancy                             Anesthesia Physical Anesthesia Plan  ASA: II  Anesthesia Plan: Epidural   Post-op Pain Management:    Induction:   PONV Risk Score and Plan:   Airway Management Planned:   Additional Equipment:   Intra-op Plan:   Post-operative Plan:   Informed Consent: I have reviewed the patients History and Physical, chart, labs and discussed the procedure including the risks, benefits and alternatives for the proposed anesthesia with the patient or authorized representative who has indicated his/her understanding and acceptance.       Plan Discussed with: Anesthesiologist  Anesthesia Plan Comments:         Anesthesia Quick Evaluation  

## 2019-10-25 NOTE — Anesthesia Procedure Notes (Signed)
Epidural Patient location during procedure: OB Start time: 10/25/2019 5:55 PM End time: 10/25/2019 6:04 PM  Staffing Anesthesiologist: Josephine Igo, MD Performed: anesthesiologist   Preanesthetic Checklist Completed: patient identified, site marked, surgical consent, pre-op evaluation, timeout performed, IV checked, risks and benefits discussed and monitors and equipment checked  Epidural Patient position: sitting Prep: site prepped and draped and DuraPrep Patient monitoring: continuous pulse ox and blood pressure Approach: midline Location: L3-L4 Injection technique: LOR air  Needle:  Needle type: Tuohy  Needle gauge: 17 G Needle length: 9 cm and 9 Needle insertion depth: 7 cm Catheter type: closed end flexible Catheter size: 19 Gauge Catheter at skin depth: 12 cm Test dose: negative and Other  Assessment Events: blood not aspirated, injection not painful, no injection resistance, negative IV test and no paresthesia  Additional Notes Patient identified. Risks and benefits discussed including failed block, incomplete  Pain control, post dural puncture headache, nerve damage, paralysis, blood pressure Changes, nausea, vomiting, reactions to medications-both toxic and allergic and post Partum back pain. All questions were answered. Patient expressed understanding and wished to proceed. Sterile technique was used throughout procedure. Epidural site was Dressed with sterile barrier dressing. No paresthesias, signs of intravascular injection Or signs of intrathecal spread were encountered.  Patient was more comfortable after the epidural was dosed. Please see RN's note for documentation of vital signs and FHR which are stable.

## 2019-10-25 NOTE — Discharge Summary (Signed)
Postpartum Discharge Summary     Patient Name: Danielle Smith DOB: [birth year] MRN: [id number]  Date of admission: 10/25/2019 Delivering Provider: Clarnce Flock   Date of discharge: 10/27/2019  Admitting diagnosis: PREG Intrauterine pregnancy: [redacted]w[redacted]d    Secondary diagnosis:  Active Problems:   Supervision of other normal pregnancy, antepartum   Late prenatal care affecting pregnancy in second trimester   Post-dates pregnancy   Anemia of pregnancy  Additional problems: None     Discharge diagnosis: Term Pregnancy Delivered                                                                                                Post partum procedures:None  Augmentation: Pitocin  Complications: None  Hospital course:  Induction of Labor With Vaginal Delivery   28y.o. yo G2P1001 at 433w0das admitted to the hospital 10/25/2019 for induction of labor.  Indication for induction: Postdates.  Patient had an uncomplicated labor course as follows: Arrived at 3cm, induced with pitocin and progressed to fully dilated.  Membrane Rupture Time/Date: 5:12 PM ,10/25/2019   Intrapartum Procedures: Episiotomy: None [1]                                         Lacerations:  2nd degree [3]  Patient had delivery of a Viable infant.  Information for the patient's newborn:  NoAutymn, Omlor0[[id number]]Delivery Method: Vaginal, Spontaneous(Filed from Delivery Summary)    10/25/2019  Details of delivery can be found in separate delivery note.  Patient had a routine postpartum course. Ferrous sulfate prescribed on discharge. Would like IUD outpatient. Patient is discharged home 10/27/19. Delivery time: 7:17 PM    Magnesium Sulfate received: No BMZ received: No Rhophylac:N/A MMR:N/A Transfusion:No  Physical exam  Vitals:   10/26/19 1058 10/26/19 1620 10/26/19 2141 10/27/19 0527  BP: 127/79 109/73 117/82 108/66  Pulse: 80 87 76 70  Resp: '18 18 16 16  ' Temp: 98 F (36.7 C) 98.9 F (37.2 C)  98.1 F (36.7 C) 98.2 F (36.8 C)  TempSrc: Oral Oral Oral Oral  SpO2:   99% 99%  Weight:      Height:       General: alert, cooperative and no distress Lochia: appropriate Uterine Fundus: firm DVT Evaluation: No evidence of DVT seen on physical exam. Labs: Lab Results  Component Value Date   WBC 10.5 10/26/2019   HGB 8.9 (L) 10/26/2019   HCT 27.2 (L) 10/26/2019   MCV 79.5 (L) 10/26/2019   PLT 135 (L) 10/26/2019   No flowsheet data found.  Discharge instruction: per After Visit Summary and "Baby and Me Booklet".  After visit meds:  Allergies as of 10/27/2019   No Known Allergies     Medication List    TAKE these medications   acetaminophen 500 MG tablet Commonly known as: TYLENOL Take 500 mg by mouth every 6 (six) hours as needed for mild pain.   calcium carbonate 500 MG chewable tablet Commonly known as: TUMS - dosed  in mg elemental calcium Chew 3 tablets by mouth as needed for indigestion or heartburn.   diphenhydrAMINE 25 mg capsule Commonly known as: BENADRYL Take 25 mg by mouth every 6 (six) hours as needed for allergies.   ferrous sulfate 325 (65 FE) MG tablet Take 1 tablet (325 mg total) by mouth daily.   PRENATAL VITAMIN PO Take by mouth.       Diet: routine diet  Activity: Advance as tolerated. Pelvic rest for 6 weeks.   Outpatient follow up:6 weeks Follow up Appt:No future appointments. Follow up Visit:   Please schedule this patient for Postpartum visit in: 6 weeks with the following provider: Any provider For C/S patients schedule nurse incision check in weeks 2 weeks: no Low risk pregnancy complicated by: n/a Delivery mode:  SVD Anticipated Birth Control:  IUD PP Procedures needed: IUD insertion at 6 weeks  Schedule Integrated [place] visit: no   Newborn Data: Live born female  Birth Weight:  4465g APGAR: 63, 9  Newborn Delivery   Birth date/time: 10/25/2019 19:17:00 Delivery type: Vaginal, Spontaneous      Baby Feeding:  Breast Disposition:home with mother   10/27/2019 Chauncey Mann, MD

## 2019-10-25 NOTE — Progress Notes (Signed)
Jaquita Bessire MRN: [id number]  Subjective: -Patient sitting on birthing ball.  Reports that she is coping well with contractions.   Objective: BP 130/84   Pulse 78   Temp 98.7 F (37.1 C) (Oral)   Resp 16   Ht 5\' 6"  (1.676 m)   Wt 106 kg   LMP 01/11/2019   SpO2 100%   BMI 37.72 kg/m  No intake/output data recorded. No intake/output data recorded.  Fetal Monitoring: FHT: 145 bpm, Mod Var, -Decels, +Accels UC: Q2-74min    Vaginal Exam: SVE:   Dilation: 6 Effacement (%): 50 Station: -3, -2 Exam by:: j. Quianna Avery,cnm Membranes:AROM Internal Monitors: None  Augmentation/Induction: Pitocin:78mUn/min Cytotec: None  Assessment:  IUP at 41 weeks Cat I FT  PDIOL  Plan: -Discussed AROM r/b including increased risk of infection, cord prolapse, fetal intolerance, and decreased labor time prior to VE. -Patient would like to refrain from AROM at current and continue current progression.  -Continue other mgmt as ordered.  Riley Churches, CNM Advanced Practice Provider, [facility] [place] 10/25/2019, 4:23 PM

## 2019-10-25 NOTE — H&P (Signed)
Danielle Smith is a 28 y.o. female, G2P1001 at 61 weeks, presenting for PDIOL. Patient receives care at [facility] and was supervised for a low risk pregnancy. Pregnancy and medical history significant for problems as listed below. She is GBS Negative and expresses a desire for coping techniques for pain management.  She is anticipating a female infant and requests IUD for PP birth control method.     Patient Active Problem List   Diagnosis Date Noted  . Post-dates pregnancy 10/25/2019  . Vaginal yeast infection 09/22/2019  . Late prenatal care affecting pregnancy in second trimester 05/11/2019  . Supervision of other normal pregnancy, antepartum 05/10/2019    History of present pregnancy:  Last evaluation:  October 20, 2019 in office with Fabian November, CNM  Nursing Staff Provider  Office Location [place] Dating  LMP- Certain   Language  English  Anatomy US  Normal, f/u scheduled to complete scan.   Flu Vaccine  10/06/19 Genetic Screen  NIPS:   AFP:   First Screen:  Quad:  DECLINED ALL  TDaP vaccine   07/28/19 Hgb A1C or  GTT Early  Third trimester: normal  Rhogam  A positive    LAB RESULTS   Feeding Plan Breast Blood Type   A pos  Contraception IUD Antibody   neg  Circumcision Girl  Rubella   immune  Pediatrician  List given  RPR   NR  Support Person Alex HBsAg   neg  Prenatal Classes Na HIV   NR  BTL Consent Na GBS  Negative   VBAC Consent Na  Pap Needs @ PP    Hgb Electro  Declined     CF Declined    SMA Declined     Waterbirth  [ ]  Class [ ]  Consent [ ]  CNM visit    OB History    Gravida  2   Para  1   Term  1   Preterm      AB      Living  1     SAB      TAB      Ectopic      Multiple      Live Births  1             Past Medical History:  Diagnosis Date  . Medical history non-contributory    Past Surgical History:  Procedure Laterality Date  . APPENDECTOMY     Family History: family history includes Breast cancer in her paternal  grandmother. Social History:  reports that she has never smoked. She has never used smokeless tobacco. She reports that she does not drink alcohol or use drugs.   Prenatal Transfer Tool  Maternal Diabetes: No Genetic Screening: Normal Maternal Ultrasounds/Referrals: Normal Fetal Ultrasounds or other Referrals:  None Maternal Substance Abuse:  No Significant Maternal Medications:  None Significant Maternal Lab Results: Group B Strep negative   Maternal Assessment:  ROS: +Contractions, -LOF, -Vaginal Bleeding, +Fetal Movement  All other systems reviewed and negative.    No Known Allergies   Dilation: 3 Effacement (%): 50 Station: -3 Exam by:: lee Blood pressure 122/76, pulse (!) 101, temperature 98.7 F (37.1 C), temperature source Oral, resp. rate 20, height 5\' 6"  (1.676 m), weight 106 kg, last menstrual period 01/11/2019, SpO2 100 %.  Physical Exam  Constitutional: She is oriented to person, place, and time. She appears well-developed and well-nourished.  HENT:  Head: Normocephalic and atraumatic.  Eyes: Conjunctivae are normal.  Neck: Normal range of  motion.  Cardiovascular: Normal rate, regular rhythm and normal heart sounds.  Respiratory: Effort normal and breath sounds normal.  GI: Soft.  Musculoskeletal: Normal range of motion.        General: Edema (+2 in LLQ, Non pitting in RLQ) present.  Neurological: She is alert and oriented to person, place, and time.  Skin: Skin is warm and dry.  Psychiatric: She has a normal mood and affect. Her behavior is normal.    Fetal Assessment: Le[place]: -Pelvis: Proven to 4167grams -EFW: 8 1/2-9lbs -Presentation:Vertex. ? Asynclitic   FHR: 140 bpm, Mod Var, -Decels, +Accels UCs:  Q52min, palpates mild    Assessment IUP at 41 weeks Cat I FT PDIOL Desires No Pain Medication  Plan: Admit to [facility]  Routine Labor and Delivery Orders per Protocol Routine IOL orders Pitocin infusing In room to complete  assessment and discuss POC: Informed of provider support for unmedicated delivery. Discussed involvement of resident in care Patient without questions or concerns. Will continue to monitor  Loann Quill, MSN 10/25/2019, 12:01 PM

## 2019-10-26 LAB — CBC
HCT: 27.2 % — ABNORMAL LOW (ref 36.0–46.0)
Hemoglobin: 8.9 g/dL — ABNORMAL LOW (ref 12.0–15.0)
MCH: 26 pg (ref 26.0–34.0)
MCHC: 32.7 g/dL (ref 30.0–36.0)
MCV: 79.5 fL — ABNORMAL LOW (ref 80.0–100.0)
Platelets: 135 10*3/uL — ABNORMAL LOW (ref 150–400)
RBC: 3.42 MIL/uL — ABNORMAL LOW (ref 3.87–5.11)
RDW: 13.5 % (ref 11.5–15.5)
WBC: 10.5 10*3/uL (ref 4.0–10.5)
nRBC: 0 % (ref 0.0–0.2)

## 2019-10-26 MED ORDER — MEASLES, MUMPS & RUBELLA VAC IJ SOLR: 0.5000 mL | Freq: Once | INTRAMUSCULAR | Status: DC

## 2019-10-26 MED ORDER — FERROUS SULFATE 325 (65 FE) MG PO TABS
325.0000 mg | ORAL_TABLET | Freq: Every day | ORAL | Status: DC
  Administered 2019-10-26 – 2019-10-27 (×2): 325 mg via ORAL
  Filled 2019-10-26 (×2): qty 1

## 2019-10-26 NOTE — Anesthesia Postprocedure Evaluation (Signed)
Anesthesia Post Note  Patient: Danielle Smith  Procedure(s) Performed: AN AD HOC LABOR EPIDURAL     Patient location during evaluation: Mother Baby Anesthesia Type: Epidural Level of consciousness: awake and alert Pain management: pain level controlled Vital Signs Assessment: post-procedure vital signs reviewed and stable Respiratory status: spontaneous breathing, nonlabored ventilation and respiratory function stable Cardiovascular status: stable Postop Assessment: no headache, no backache and epidural receding Anesthetic complications: no Comments: Spoke to pt on the phone. No anesthetic complications noted    Last Vitals:  Vitals:   10/26/19 0242 10/26/19 0638  BP: 113/77 115/77  Pulse: 81 85  Resp: 18 16  Temp: 37.4 C 37.3 C  SpO2: 98% 98%    Last Pain:  Vitals:   10/26/19 0712  TempSrc:   PainSc: Asleep   Pain Goal:                   Riki Sheer

## 2019-10-26 NOTE — Progress Notes (Signed)
Post Partum Day 1 Subjective: no complaints, up ad lib, voiding, tolerating PO, + flatus and breastfeeding is going well  Objective: Blood pressure 127/79, pulse 80, temperature 98 F (36.7 C), temperature source Oral, resp. rate 18, height 5\' 6"  (1.676 m), weight 106 kg, last menstrual period 01/11/2019, SpO2 98 %.  Physical Exam:  General: alert and cooperative Lochia: appropriate Uterine Fundus: firm Incision: N/A DVT Evaluation: No evidence of DVT seen on physical exam. No cords or calf tenderness. No significant calf/ankle edema.  Recent Labs    10/25/19 0947 10/26/19 0530  HGB 9.8* 8.9*  HCT 30.8* 27.2*    Assessment/Plan: Plan for discharge tomorrow, Breastfeeding and Contraception [facility]UD at [facility] visit  Ferrous sulfate daily added today for anemia, plan to send with Rx at discharge   LOS: 1 day   Kerry Hough 10/26/2019, 1:33 PM

## 2019-10-26 NOTE — Lactation Note (Signed)
This note was copied from a baby's chart. Lactation Consultation Note Baby 33 hrs old. LC went into rm. Mom BF.  Mom has very short semi flat hard nipples. Mom stated she BF for 2 yrs to her 28 1/27 yr old.  Mom has large breast that are hard. Mom denies leaking. Noted generalized edema to legs and feet. LC hand expressed and colostrum poured from breast.  Hand pump given to evert nipple. Reverse pressure slightly helpful.  Baby BF well, encouraged to get cheeks to breast. Mom states her nipples are everted more than what they are now. Explained d/t mom receiving a lot of fluids. Newborn behavior, STS, I&O, discussed. Mom has no questions at this time. Lactation brochure given.  Patient Name: Danielle Smith Date: 10/26/2019 Reason for consult: Initial assessment;Term   Maternal Data Has patient been taught Hand Expression?: Yes Does the patient have breastfeeding experience prior to this delivery?: Yes  Feeding Feeding Type: Breast Fed  LATCH Score Latch: Grasps breast easily, tongue down, lips flanged, rhythmical sucking.  Audible Swallowing: Spontaneous and intermittent  Type of Nipple: Everted at rest and after stimulation(short shaft)  Comfort (Breast/Nipple): Engorged, cracked, bleeding, large blisters, severe discomfort(breast hard as if full)  Hold (Positioning): No assistance needed to correctly position infant at breast.  LATCH Score: 8  Interventions Interventions: Breast feeding basics reviewed;Support pillows;Skin to skin;Position options;Breast massage;Expressed milk;Hand express;Pre-pump if needed;Breast compression;Reverse pressure;Hand pump  Lactation Tools Discussed/Used Tools: Pump Breast pump type: Manual WIC Program: No Pump Review: Setup, frequency, and cleaning;Milk Storage Initiated by:: Allayne Stack RN IBCLC Date initiated:: 10/26/19   Consult Status Consult Status: Follow-up Date: 10/27/19 Follow-up type: In-patient    Laney Bagshaw,  Elta [place] 10/26/2019, 4:55 AM

## 2019-10-26 NOTE — Lactation Note (Signed)
This note was copied from a baby's chart. Lactation Consultation Note  Patient Name: Danielle Smith NIOEV'O Date: 10/26/2019 Reason for consult: Follow-up assessment;Term;Nipple pain/trauma Randel Books is 10 hours old  LC checked on mom and expressed breast feeding was going well  And baby last fed around 12 n and fed well.  Per mom nipples are alittle sore . LC offered to assess and mom showed [place] she could expressed colostrum. Left areola semi compressible with swelling, and right more compressible with some edema.  LC instructed on the use shells between feedings except when sleeping or  At least 10 mins prior to feeding for reverse pressure due to edema and to elongate the nipple / areola complex for a deeper latch.  Mom has a hand pump .  Nipples are both pink - red in color . Mom is fair skin.  Mom receptive to teaching.   Maternal Data Has patient been taught Hand Expression?: Yes  Feeding Feeding Type: Breast Fed  LATCH Score Latch: (per mom baby has fed around 12 N)                 Interventions Interventions: Breast feeding basics reviewed  Lactation Tools Discussed/Used Tools: Shells;Pump Shell Type: Inverted Breast pump type: Manual   Consult Status Consult Status: Follow-up Date: 10/27/19 Follow-up type: In-patient    [place] 10/26/2019, 2:23 PM

## 2019-10-27 DIAGNOSIS — O99019 Anemia complicating pregnancy, unspecified trimester: Secondary | ICD-10-CM

## 2019-10-27 MED ORDER — FERROUS SULFATE 325 (65 FE) MG PO TABS: 325.0000 mg | ORAL_TABLET | Freq: Every day | ORAL | 3 refills | Status: AC

## 2019-10-27 NOTE — Lactation Note (Signed)
This note was copied from a baby's chart. Lactation Consultation Note  Patient Name: Danielle Smith JSHFW'Y Date: 10/27/2019   Baby 17 hours old and sleeping.  Mother states baby cluster fed last night. Mother denies questions or concerns. Feed on demand with cues.  Goal 8-12+ times per day after first 24 hrs.  Place baby STS if not cueing.  Reviewed engorgement care and monitoring voids/stools.      Maternal Data    Feeding    LATCH Score                   Interventions    Lactation Tools Discussed/Used     Consult Status      Carlye Grippe 10/27/2019, 10:13 AM

## 2019-11-01 ENCOUNTER — Encounter (HOSPITAL_COMMUNITY): Payer: Self-pay | Admitting: *Deleted

## 2019-12-06 ENCOUNTER — Ambulatory Visit (INDEPENDENT_AMBULATORY_CARE_PROVIDER_SITE_OTHER): Payer: BC Managed Care – PPO | Admitting: Obstetrics and Gynecology

## 2019-12-06 ENCOUNTER — Other Ambulatory Visit: Payer: Self-pay

## 2019-12-06 VITALS — BP 116/68 | HR 89 | Ht 66.0 in | Wt 208.0 lb

## 2019-12-06 DIAGNOSIS — Z124 Encounter for screening for malignant neoplasm of cervix: Secondary | ICD-10-CM | POA: Diagnosis not present

## 2019-12-06 DIAGNOSIS — Z3202 Encounter for pregnancy test, result negative: Secondary | ICD-10-CM | POA: Diagnosis not present

## 2019-12-06 DIAGNOSIS — Z01419 Encounter for gynecological examination (general) (routine) without abnormal findings: Secondary | ICD-10-CM

## 2019-12-06 DIAGNOSIS — Z01812 Encounter for preprocedural laboratory examination: Secondary | ICD-10-CM

## 2019-12-06 DIAGNOSIS — Z30430 Encounter for insertion of intrauterine contraceptive device: Secondary | ICD-10-CM | POA: Diagnosis not present

## 2019-12-06 LAB — POCT URINE PREGNANCY: Preg Test, Ur: NEGATIVE

## 2019-12-06 MED ORDER — PARAGARD INTRAUTERINE COPPER IU IUD
INTRAUTERINE_SYSTEM | Freq: Once | INTRAUTERINE | Status: AC
  Administered 2019-12-06: 13:00:00 via INTRAUTERINE

## 2019-12-06 NOTE — Progress Notes (Signed)
Pt is considering Paragard

## 2019-12-06 NOTE — Progress Notes (Deleted)
GYNECOLOGY ANNUAL PREVENTATIVE CARE ENCOUNTER NOTE  History:     Jeanette Rauth is a 28 y.o. G44P2002 female here for a routine annual gynecologic exam.  Current complaints: ***.   Denies abnormal vaginal bleeding, discharge, pelvic pain, problems with intercourse or other gynecologic concerns.    Gynecologic History Patient's last menstrual period was 01/11/2019. Contraception: {method:5051} Last Pap: ***. Results were: {norm/abn:16337} with negative HPV Last mammogram: ***. Results were: {norm/abn:16337}  Obstetric History OB History  Gravida Para Term Preterm AB Living  2 2 2     2   SAB TAB Ectopic Multiple Live Births        0 2    # Outcome Date GA Lbr Len/2nd Weight Sex Delivery Anes PTL Lv  2 Term 10/25/19 [redacted]w[redacted]d 01:48 / 00:17 9 lb 13.5 oz (4.465 kg) F Vag-Spont EPI, Local  LIV  1 Term 05/05/16 [redacted]w[redacted]d  9 lb 3 oz (4.167 kg) M Vag-Spont   LIV    Past Medical History:  Diagnosis Date  . Medical history non-contributory     Past Surgical History:  Procedure Laterality Date  . APPENDECTOMY      Current Outpatient Medications on File Prior to Visit  Medication Sig Dispense Refill  . acetaminophen (TYLENOL) 500 MG tablet Take 500 mg by mouth every 6 (six) hours as needed for mild pain.    . calcium carbonate (TUMS - DOSED IN MG ELEMENTAL CALCIUM) 500 MG chewable tablet Chew 3 tablets by mouth as needed for indigestion or heartburn.    . diphenhydrAMINE (BENADRYL) 25 mg capsule Take 25 mg by mouth every 6 (six) hours as needed for allergies.    . ferrous sulfate 325 (65 FE) MG tablet Take 1 tablet (325 mg total) by mouth daily. (Patient not taking: Reported on 12/06/2019) 90 tablet 3  . Prenatal Vit-Fe Fumarate-FA (PRENATAL VITAMIN PO) Take by mouth.     No current facility-administered medications on file prior to visit.     No Known Allergies  Social History:  reports that she has never smoked. She has never used smokeless tobacco. She reports that she does not drink  alcohol or use drugs.  Family History  Problem Relation Age of Onset  . Breast cancer Paternal Grandmother     The following portions of the patient's history were reviewed and updated as appropriate: allergies, current medications, past family history, past medical history, past social history, past surgical history and problem list.  Review of Systems Pertinent items noted in HPI and remainder of comprehensive ROS otherwise negative.  Physical Exam:  BP 116/68   Pulse 89   Ht 5\' 6"  (1.676 m)   Wt 208 lb (94.3 kg)   LMP 01/11/2019   BMI 33.57 kg/m  CONSTITUTIONAL: Well-developed, well-nourished female in no acute distress.  HENT:  Normocephalic, atraumatic, External right and left ear normal. Oropharynx is clear and moist EYES: Conjunctivae and EOM are normal. Pupils are equal, round, and reactive to light. No scleral icterus.  NECK: Normal range of motion, supple, no masses.  Normal thyroid.  SKIN: Skin is warm and dry. No rash noted. Not diaphoretic. No erythema. No pallor. MUSCULOSKELETAL: Normal range of motion. No tenderness.  No cyanosis, clubbing, or edema.  2+ distal pulses. NEUROLOGIC: Alert and oriented to person, place, and time. Normal reflexes, muscle tone coordination.  PSYCHIATRIC: Normal mood and affect. Normal behavior. Normal judgment and thought content. CARDIOVASCULAR: Normal heart rate noted, regular rhythm RESPIRATORY: Clear to auscultation bilaterally. Effort and breath sounds  normal, no problems with respiration noted. BREASTS: Symmetric in size. No masses, tenderness, skin changes, nipple drainage, or lymphadenopathy bilaterally. ABDOMEN: Soft, no distention noted.  No tenderness, rebound or guarding.  PELVIC: Normal appearing external genitalia and urethral meatus; normal appearing vaginal mucosa and cervix.  No abnormal discharge noted.  Pap smear obtained.  Normal uterine size, no other palpable masses, no uterine or adnexal tenderness.   Assessment and  Plan:    1. Pre-procedure lab exam *** - POCT urine pregnancy  Will follow up results of pap smear and manage accordingly. Mammogram scheduled Routine preventative health maintenance measures emphasized. Please refer to After Visit Summary for other counseling recommendations.     Product/process development scientist for [employer], [place] NOTE  Vetra Shinall is a 28 y.o. [phone] here for paraguard IUD insertion. No GYN concerns.  Last pap smear was on *** and was normal.  IUD Insertion Procedure Note Patient identified, informed consent performed, consent signed.   Discussed risks of irregular bleeding, cramping, infection, malpositioning or misplacement of the IUD outside the uterus which may require further procedure such as laparoscopy. Time out was performed.  Urine pregnancy test negative.  Speculum placed in the vagina.  Cervix visualized.  Cleaned with Betadine x 2.  Grasped anteriorly with a single tooth tenaculum.  Uterus sounded to *** cm.  *** IUD placed per manufacturer's recommendations.  Strings trimmed to 3 cm. Tenaculum was removed, good hemostasis noted.  Patient tolerated procedure well.   Patient was given post-procedure instructions.  She was advised to have backup contraception for one week.  Patient was also asked to check IUD strings periodically and follow up in 4 weeks for IUD check.

## 2019-12-06 NOTE — Progress Notes (Signed)
Post Partum Exam  Danielle Smith is a 28 y.o. [id number] female who presents for a postpartum visit. She is 6 weeks postpartum following a spontaneous vaginal delivery. I have fully reviewed the prenatal and intrapartum course. The delivery was at 92 gestational weeks.  Anesthesia: epidural. Postpartum course has been unremarkable. Baby's course has been unremarkable. Baby is feeding by breast. Bleeding no bleeding. Bowel function is normal. Bladder function is normal. Patient is not sexually active. Contraception method is IUD. Postpartum depression screening:neg   The following portions of the patient's history were reviewed and updated as appropriate: allergies, current medications, past family history, past medical history, past social history, past surgical history and problem list. Last pap smear done unknown.   Review of Systems Pertinent items are noted in HPI.    Objective:  Blood pressure 116/68, pulse 89, height 5\' 6"  (1.676 m), weight 208 lb (94.3 kg), last menstrual period 01/11/2019, unknown if currently breastfeeding.  General:  alert, cooperative and appears stated age  Lungs: clear to auscultation bilaterally  Heart:  regular rate and rhythm, S1, S2 normal, no murmur, click, rub or gallop  Abdomen: soft, non-tender; bowel sounds normal; no masses,  no organomegaly   Vulva:  normal  Vagina: normal vagina, no discharge, exudate, lesion, or erythema  Cervix:  no lesions  Corpus: not examined  Adnexa:  not evaluated  Rectal Exam: Not performed.        Assessment:    Normal postpartum exam. Pap smear done at today's visit.   Plan:   1. Contraception: IUD 2. Return in 4 weeks for string check       Danielle Smith NOTE  Danielle Smith is a 28 y.o. [phone] here for copper IUD insertion. No GYN concerns.  Last pap smear was on 11/2019 and was normal.  IUD Insertion Procedure Note Patient identified, informed consent performed, consent signed.   Discussed  risks of irregular bleeding, cramping, infection, malpositioning or misplacement of the IUD outside the uterus which may require further procedure such as laparoscopy. Time out was performed.  Urine pregnancy test negative.  Speculum placed in the vagina.  Cervix visualized.  Cleaned with Betadine x 2.  Grasped anteriorly with a single tooth tenaculum.  Uterus sounded to 7 cm.  Copper  IUD placed per manufacturer's recommendations.  Strings trimmed to 3 cm. Tenaculum was removed, good hemostasis noted.  Patient tolerated procedure well.   Patient was given post-procedure instructions.  She was advised to have backup contraception for one week.  Patient was also asked to check IUD strings periodically and follow up in 4 weeks for IUD check.    Danielle Smith I, NP 12/07/2019 10:02 AM

## 2019-12-07 LAB — CYTOLOGY - PAP: Diagnosis: NEGATIVE
